# Patient Record
Sex: Male | Born: 1950 | Race: White | Hispanic: No | Smoking: Former smoker
Health system: Southern US, Community
[De-identification: ages and names within clinical notes are randomized; demographics above are authoritative.]

## PROBLEM LIST (undated history)

## (undated) ENCOUNTER — Encounter

## (undated) ENCOUNTER — Telehealth

## (undated) ENCOUNTER — Ambulatory Visit

## (undated) ENCOUNTER — Encounter: Attending: Surgery | Primary: Surgery

## (undated) ENCOUNTER — Non-Acute Institutional Stay: Payer: MEDICARE

## (undated) ENCOUNTER — Ambulatory Visit: Payer: MEDICARE

## (undated) ENCOUNTER — Ambulatory Visit: Attending: Nephrology | Primary: Nephrology

## (undated) DIAGNOSIS — C189 Malignant neoplasm of colon, unspecified: Secondary | ICD-10-CM

## (undated) DIAGNOSIS — C260 Malignant neoplasm of intestinal tract, part unspecified: Secondary | ICD-10-CM

## (undated) DIAGNOSIS — K219 Gastro-esophageal reflux disease without esophagitis: Secondary | ICD-10-CM

## (undated) DIAGNOSIS — F32A Depression, unspecified: Secondary | ICD-10-CM

## (undated) DIAGNOSIS — I10 Essential (primary) hypertension: Secondary | ICD-10-CM

## (undated) DIAGNOSIS — F419 Anxiety disorder, unspecified: Secondary | ICD-10-CM

## (undated) DIAGNOSIS — I350 Nonrheumatic aortic (valve) stenosis: Secondary | ICD-10-CM

## (undated) DIAGNOSIS — N189 Chronic kidney disease, unspecified: Secondary | ICD-10-CM

## (undated) DIAGNOSIS — E119 Type 2 diabetes mellitus without complications: Secondary | ICD-10-CM

## (undated) DIAGNOSIS — Z944 Liver transplant status: Secondary | ICD-10-CM

## (undated) DIAGNOSIS — F329 Major depressive disorder, single episode, unspecified: Secondary | ICD-10-CM

## (undated) DIAGNOSIS — R112 Nausea with vomiting, unspecified: Secondary | ICD-10-CM

## (undated) DIAGNOSIS — R011 Cardiac murmur, unspecified: Secondary | ICD-10-CM

## (undated) DIAGNOSIS — E7800 Pure hypercholesterolemia, unspecified: Secondary | ICD-10-CM

## (undated) DIAGNOSIS — K635 Polyp of colon: Secondary | ICD-10-CM

## (undated) DIAGNOSIS — G629 Polyneuropathy, unspecified: Secondary | ICD-10-CM

## (undated) DIAGNOSIS — Z9889 Other specified postprocedural states: Secondary | ICD-10-CM

## (undated) HISTORY — DX: Anxiety disorder, unspecified: F41.9

## (undated) HISTORY — DX: Polyneuropathy, unspecified: G62.9

## (undated) HISTORY — DX: Gastro-esophageal reflux disease without esophagitis: K21.9

## (undated) HISTORY — PX: TONSILLECTOMY: SUR1361

## (undated) HISTORY — DX: Pure hypercholesterolemia, unspecified: E78.00

## (undated) HISTORY — DX: Depression, unspecified: F32.A

## (undated) HISTORY — PX: COLON SURGERY: SHX602

## (undated) HISTORY — DX: Polyp of colon: K63.5

## (undated) HISTORY — PX: CHOLECYSTECTOMY: SHX55

## (undated) HISTORY — PX: LIVER TRANSPLANT: SHX410

## (undated) MED ORDER — MEGESTROL 40 MG TABLET: Freq: Every day | ORAL | 0 days

## (undated) MED ORDER — VITAMIN A CAPSULE 3,000 MCG RAE (10000 UNITS): Freq: Every day | ORAL | 0.00000 days

## (undated) MED ORDER — MELATONIN 3 MG CAPSULE: ORAL | 0 days

---
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
## 1994-04-25 HISTORY — PX: LIVER TRANSPLANT: SHX410

## 2011-07-04 DIAGNOSIS — Z944 Liver transplant status: Secondary | ICD-10-CM

## 2013-08-19 DIAGNOSIS — E119 Type 2 diabetes mellitus without complications: Secondary | ICD-10-CM

## 2013-08-19 DIAGNOSIS — C189 Malignant neoplasm of colon, unspecified: Secondary | ICD-10-CM | POA: Insufficient documentation

## 2014-09-28 DIAGNOSIS — E119 Type 2 diabetes mellitus without complications: Secondary | ICD-10-CM | POA: Insufficient documentation

## 2014-09-28 DIAGNOSIS — F329 Major depressive disorder, single episode, unspecified: Secondary | ICD-10-CM | POA: Diagnosis present

## 2014-09-28 DIAGNOSIS — Z79899 Other long term (current) drug therapy: Secondary | ICD-10-CM | POA: Insufficient documentation

## 2014-09-28 DIAGNOSIS — E1142 Type 2 diabetes mellitus with diabetic polyneuropathy: Secondary | ICD-10-CM | POA: Insufficient documentation

## 2014-09-28 DIAGNOSIS — E1169 Type 2 diabetes mellitus with other specified complication: Secondary | ICD-10-CM | POA: Insufficient documentation

## 2014-09-28 DIAGNOSIS — K219 Gastro-esophageal reflux disease without esophagitis: Secondary | ICD-10-CM | POA: Insufficient documentation

## 2014-09-28 DIAGNOSIS — M109 Gout, unspecified: Secondary | ICD-10-CM | POA: Insufficient documentation

## 2014-10-01 DIAGNOSIS — E291 Testicular hypofunction: Secondary | ICD-10-CM | POA: Insufficient documentation

## 2015-06-19 DIAGNOSIS — M5416 Radiculopathy, lumbar region: Secondary | ICD-10-CM | POA: Insufficient documentation

## 2015-06-19 DIAGNOSIS — H532 Diplopia: Secondary | ICD-10-CM | POA: Insufficient documentation

## 2015-06-19 DIAGNOSIS — G529 Cranial nerve disorder, unspecified: Secondary | ICD-10-CM | POA: Insufficient documentation

## 2015-06-23 DIAGNOSIS — G43009 Migraine without aura, not intractable, without status migrainosus: Secondary | ICD-10-CM | POA: Insufficient documentation

## 2015-06-26 DIAGNOSIS — E7800 Pure hypercholesterolemia, unspecified: Secondary | ICD-10-CM | POA: Insufficient documentation

## 2015-07-10 DIAGNOSIS — M542 Cervicalgia: Secondary | ICD-10-CM | POA: Insufficient documentation

## 2015-11-27 DIAGNOSIS — E11649 Type 2 diabetes mellitus with hypoglycemia without coma: Secondary | ICD-10-CM | POA: Insufficient documentation

## 2016-03-02 DIAGNOSIS — R634 Abnormal weight loss: Secondary | ICD-10-CM | POA: Insufficient documentation

## 2016-09-26 DIAGNOSIS — E875 Hyperkalemia: Secondary | ICD-10-CM | POA: Insufficient documentation

## 2016-10-14 ENCOUNTER — Inpatient Hospital Stay (HOSPITAL_COMMUNITY)
Admission: EM | Admit: 2016-10-14 | Discharge: 2016-10-17 | DRG: 872 | Disposition: A | Discharge status: Home / Self Care | Payer: Medicare Other | Attending: Family Medicine | Admitting: Family Medicine

## 2016-10-14 ENCOUNTER — Emergency Department (HOSPITAL_COMMUNITY): Payer: Medicare Other

## 2016-10-14 ENCOUNTER — Encounter (HOSPITAL_COMMUNITY): Payer: Self-pay | Admitting: Emergency Medicine

## 2016-10-14 DIAGNOSIS — D696 Thrombocytopenia, unspecified: Secondary | ICD-10-CM | POA: Diagnosis present

## 2016-10-14 DIAGNOSIS — R109 Unspecified abdominal pain: Secondary | ICD-10-CM | POA: Diagnosis not present

## 2016-10-14 DIAGNOSIS — A419 Sepsis, unspecified organism: Secondary | ICD-10-CM

## 2016-10-14 DIAGNOSIS — F3341 Major depressive disorder, recurrent, in partial remission: Secondary | ICD-10-CM | POA: Diagnosis present

## 2016-10-14 DIAGNOSIS — E1122 Type 2 diabetes mellitus with diabetic chronic kidney disease: Secondary | ICD-10-CM | POA: Diagnosis present

## 2016-10-14 DIAGNOSIS — K7290 Hepatic failure, unspecified without coma: Secondary | ICD-10-CM | POA: Diagnosis present

## 2016-10-14 DIAGNOSIS — E860 Dehydration: Secondary | ICD-10-CM | POA: Diagnosis present

## 2016-10-14 DIAGNOSIS — Z9049 Acquired absence of other specified parts of digestive tract: Secondary | ICD-10-CM

## 2016-10-14 DIAGNOSIS — E875 Hyperkalemia: Secondary | ICD-10-CM | POA: Diagnosis present

## 2016-10-14 DIAGNOSIS — N3289 Other specified disorders of bladder: Secondary | ICD-10-CM | POA: Diagnosis present

## 2016-10-14 DIAGNOSIS — K529 Noninfective gastroenteritis and colitis, unspecified: Secondary | ICD-10-CM | POA: Diagnosis present

## 2016-10-14 DIAGNOSIS — E1140 Type 2 diabetes mellitus with diabetic neuropathy, unspecified: Secondary | ICD-10-CM | POA: Diagnosis present

## 2016-10-14 DIAGNOSIS — F418 Other specified anxiety disorders: Secondary | ICD-10-CM | POA: Diagnosis present

## 2016-10-14 DIAGNOSIS — E872 Acidosis: Secondary | ICD-10-CM | POA: Diagnosis present

## 2016-10-14 DIAGNOSIS — Z6838 Body mass index (BMI) 38.0-38.9, adult: Secondary | ICD-10-CM

## 2016-10-14 DIAGNOSIS — Z85038 Personal history of other malignant neoplasm of large intestine: Secondary | ICD-10-CM

## 2016-10-14 DIAGNOSIS — K7469 Other cirrhosis of liver: Secondary | ICD-10-CM | POA: Diagnosis present

## 2016-10-14 DIAGNOSIS — F329 Major depressive disorder, single episode, unspecified: Secondary | ICD-10-CM | POA: Diagnosis present

## 2016-10-14 DIAGNOSIS — M109 Gout, unspecified: Secondary | ICD-10-CM | POA: Diagnosis present

## 2016-10-14 DIAGNOSIS — R509 Fever, unspecified: Secondary | ICD-10-CM | POA: Diagnosis present

## 2016-10-14 DIAGNOSIS — I129 Hypertensive chronic kidney disease with stage 1 through stage 4 chronic kidney disease, or unspecified chronic kidney disease: Secondary | ICD-10-CM | POA: Diagnosis present

## 2016-10-14 DIAGNOSIS — D849 Immunodeficiency, unspecified: Secondary | ICD-10-CM

## 2016-10-14 DIAGNOSIS — E669 Obesity, unspecified: Secondary | ICD-10-CM | POA: Diagnosis present

## 2016-10-14 DIAGNOSIS — N183 Chronic kidney disease, stage 3 (moderate): Secondary | ICD-10-CM | POA: Diagnosis present

## 2016-10-14 DIAGNOSIS — N179 Acute kidney failure, unspecified: Secondary | ICD-10-CM | POA: Diagnosis present

## 2016-10-14 DIAGNOSIS — E119 Type 2 diabetes mellitus without complications: Secondary | ICD-10-CM

## 2016-10-14 DIAGNOSIS — D899 Disorder involving the immune mechanism, unspecified: Secondary | ICD-10-CM

## 2016-10-14 DIAGNOSIS — Z944 Liver transplant status: Secondary | ICD-10-CM

## 2016-10-14 DIAGNOSIS — Z794 Long term (current) use of insulin: Secondary | ICD-10-CM

## 2016-10-14 DIAGNOSIS — E785 Hyperlipidemia, unspecified: Secondary | ICD-10-CM | POA: Diagnosis present

## 2016-10-14 DIAGNOSIS — A084 Viral intestinal infection, unspecified: Secondary | ICD-10-CM | POA: Diagnosis present

## 2016-10-14 HISTORY — DX: Cardiac murmur, unspecified: R01.1

## 2016-10-14 HISTORY — DX: Malignant neoplasm of colon, unspecified: C18.9

## 2016-10-14 HISTORY — DX: Type 2 diabetes mellitus without complications: E11.9

## 2016-10-14 HISTORY — DX: Liver transplant status: Z94.4

## 2016-10-14 HISTORY — DX: Essential (primary) hypertension: I10

## 2016-10-14 LAB — COMPREHENSIVE METABOLIC PANEL
ALT: 84 U/L — AB (ref 17–63)
AST: 158 U/L — AB (ref 15–41)
Albumin: 3.7 g/dL (ref 3.5–5.0)
Alkaline Phosphatase: 251 U/L — ABNORMAL HIGH (ref 38–126)
Anion gap: 8 (ref 5–15)
BUN: 16 mg/dL (ref 6–20)
CHLORIDE: 106 mmol/L (ref 101–111)
CO2: 23 mmol/L (ref 22–32)
CREATININE: 1.4 mg/dL — AB (ref 0.61–1.24)
Calcium: 9.3 mg/dL (ref 8.9–10.3)
GFR calc non Af Amer: 51 mL/min — ABNORMAL LOW (ref >60)
GFR, EST AFRICAN AMERICAN: 59 mL/min — AB (ref >60)
Glucose, Bld: 121 mg/dL — ABNORMAL HIGH (ref 65–99)
POTASSIUM: 5.1 mmol/L (ref 3.5–5.1)
Sodium: 137 mmol/L (ref 135–145)
Total Bilirubin: 2.1 mg/dL — ABNORMAL HIGH (ref 0.3–1.2)
Total Protein: 6.8 g/dL (ref 6.5–8.1)

## 2016-10-14 LAB — URINALYSIS, ROUTINE W REFLEX MICROSCOPIC
Bilirubin Urine: NEGATIVE
GLUCOSE, UA: NEGATIVE mg/dL
Hgb urine dipstick: NEGATIVE
KETONES UR: NEGATIVE mg/dL
Leukocytes, UA: NEGATIVE
Nitrite: NEGATIVE
Protein, ur: NEGATIVE mg/dL
Specific Gravity, Urine: 1.009 (ref 1.005–1.030)
pH: 5 (ref 5.0–8.0)

## 2016-10-14 LAB — CBG MONITORING, ED: Glucose-Capillary: 114 mg/dL — ABNORMAL HIGH (ref 65–99)

## 2016-10-14 LAB — CBC WITH DIFFERENTIAL/PLATELET
Basophils Absolute: 0 10*3/uL (ref 0.0–0.1)
Basophils Relative: 0 %
EOS PCT: 2 %
Eosinophils Absolute: 0.2 10*3/uL (ref 0.0–0.7)
HCT: 37.8 % — ABNORMAL LOW (ref 39.0–52.0)
Hemoglobin: 12.3 g/dL — ABNORMAL LOW (ref 13.0–17.0)
Lymphocytes Relative: 10 %
Lymphs Abs: 1 10*3/uL (ref 0.7–4.0)
MCH: 29.5 pg (ref 26.0–34.0)
MCHC: 32.5 g/dL (ref 30.0–36.0)
MCV: 90.6 fL (ref 78.0–100.0)
Monocytes Absolute: 1 10*3/uL (ref 0.1–1.0)
Monocytes Relative: 10 %
Neutro Abs: 7.3 10*3/uL (ref 1.7–7.7)
Neutrophils Relative %: 78 %
PLATELETS: 111 10*3/uL — AB (ref 150–400)
RBC: 4.17 MIL/uL — ABNORMAL LOW (ref 4.22–5.81)
RDW: 14.1 % (ref 11.5–15.5)
WBC: 9.4 10*3/uL (ref 4.0–10.5)

## 2016-10-14 LAB — I-STAT CG4 LACTIC ACID, ED
Lactic Acid, Venous: 3.38 mmol/L (ref 0.5–1.9)
Lactic Acid, Venous: 2.22 mmol/L (ref 0.5–1.9)

## 2016-10-14 MED ORDER — PIPERACILLIN-TAZOBACTAM 3.375 G IVPB 30 MIN
3.3750 g | Freq: Once | INTRAVENOUS | Status: AC
  Administered 2016-10-14: 3.375 g via INTRAVENOUS
  Filled 2016-10-14: qty 50

## 2016-10-14 MED ORDER — VANCOMYCIN HCL IN DEXTROSE 750-5 MG/150ML-% IV SOLN
750.0000 mg | Freq: Two times a day (BID) | INTRAVENOUS | Status: DC
  Administered 2016-10-15 – 2016-10-17 (×5): 750 mg via INTRAVENOUS
  Filled 2016-10-14 (×5): qty 150

## 2016-10-14 MED ORDER — SODIUM CHLORIDE 0.9 % IV BOLUS (SEPSIS)
1000.0000 mL | Freq: Once | INTRAVENOUS | Status: AC
  Administered 2016-10-14: 1000 mL via INTRAVENOUS

## 2016-10-14 MED ORDER — VANCOMYCIN HCL IN DEXTROSE 1-5 GM/200ML-% IV SOLN
1000.0000 mg | Freq: Once | INTRAVENOUS | Status: AC
  Administered 2016-10-14: 1000 mg via INTRAVENOUS
  Filled 2016-10-14: qty 200

## 2016-10-14 MED ORDER — SODIUM CHLORIDE 0.9 % IV BOLUS (SEPSIS): 1000.0000 mL | Freq: Once | INTRAVENOUS | Status: DC

## 2016-10-14 MED ORDER — PIPERACILLIN-TAZOBACTAM 3.375 G IVPB
3.3750 g | Freq: Three times a day (TID) | INTRAVENOUS | Status: DC
  Administered 2016-10-15 – 2016-10-17 (×8): 3.375 g via INTRAVENOUS
  Filled 2016-10-14 (×9): qty 50

## 2016-10-14 MED ORDER — LACTATED RINGERS IV BOLUS (SEPSIS)
1000.0000 mL | Freq: Once | INTRAVENOUS | Status: AC
  Administered 2016-10-14: 1000 mL via INTRAVENOUS

## 2016-10-14 MED ORDER — IOPAMIDOL (ISOVUE-300) INJECTION 61%
INTRAVENOUS | Status: AC
  Administered 2016-10-14: 100 mL
  Filled 2016-10-14: qty 100

## 2016-10-14 NOTE — ED Notes (Signed)
Patient transported to X-ray 

## 2016-10-14 NOTE — ED Triage Notes (Signed)
Pt st's he started having elevated temp earlier today with no other symptoms.  Pt st's his fever at home was 103 and he took tylenol for same.  Pt is a liver transplant pt.  Pt also st's he has been having night sweats.

## 2016-10-14 NOTE — Progress Notes (Signed)
Pharmacy Antibiotic Note Allen Schultz is a 67 y.o. male admitted on 10/14/2016 with fever and night sweats. Pt has hx of liver transplant and is on antirejection medications. LA 3.3, Tm 103, WBC wnl. Initially a vancomycin 1 g doses was ordered and given in the ED. I discussed with RN, will give an additional 1 g to equal a 2 g load.   Plan: -Vancomycin 2 g IV x1 then 750/12h -Zosyn 3.375 g IV q8h -Monitor renal fx, cultures, VT at Css   Height: 5\' 10"  (177.8 cm) Weight: 262 lb (118.8 kg) IBW/kg (Calculated) : 73  Temp (24hrs), Avg:101.2 F (38.4 C), Min:101.2 F (38.4 C), Max:101.2 F (38.4 C)   Recent Labs Lab 10/14/16 1920 10/14/16 1929  WBC 9.4  --   CREATININE 1.40*  --   LATICACIDVEN  --  3.38*    Estimated Creatinine Clearance: 67.9 mL/min (A) (by C-G formula based on SCr of 1.4 mg/dL (H)).    Allergies not on file  Antimicrobials this admission: 6/22 zosyn > 6/22 vancomycin >   Dose adjustments this admission: N/A   Microbiology results: 6/22 blood cx: 6/22 urine cx:   Harvel Quale 10/14/2016 8:08 PM

## 2016-10-14 NOTE — ED Notes (Signed)
Per pt request, pt's brother called by this nurse to update brother on plan of care for pt to be admitted to the hospital.

## 2016-10-14 NOTE — ED Notes (Signed)
Patient transported to CT 

## 2016-10-14 NOTE — ED Notes (Signed)
[facility] contacted to activate Code Sepsis 

## 2016-10-14 NOTE — ED Provider Notes (Signed)
[place] DEPT Provider Note   CSN: [id number] Arrival date & time: 10/14/16  1812     History   Chief Complaint Chief Complaint  Patient presents with  . Fever    HPI Allen Schultz is a 66 y.o. male.  HPI   66 yo M with h/o DM, HTN, HLD, colon CA s/p partial resection, h/o liver failure s/p liver transplant at [facility] on prograf who p/w fever. Pt states he woke up this am feeling mildly nauseous but otherwise well. He has not had an appetite and has had very little to eat or drink. At around 2 PM, he began to experience severe chills. He began shaking severely and had to sit in his car, in the heat, to feel warm. He has since had fever up to 103F orally. He took one 325 mg tylenol. He called his endocrinologist after trying his transplant/PCP docs and was told to come to the ED. He o/w denies complaints. He has persistent mild nausea but no abd pain, no vomiting or diarrhea. nO recent sick contacts. No rhinorrhea, congestion, or cough.  Past Medical History:  Diagnosis Date  . Colon cancer ([place])   . Diabetes mellitus without complication ([place])   . Heart murmur   . Hypertension   . Liver transplant recipient [facility])     Patient Active Problem List   Diagnosis Date Noted  . Liver transplant recipient [facility]) 10/15/2016  . Immunosuppressed status ([place]) 10/15/2016  . CKD (chronic kidney disease), stage III 10/15/2016  . Fever and chills 10/15/2016  . Bladder wall thickening 10/15/2016  . Thrombocytopenia ([place]) 10/15/2016  . Depression with anxiety 10/15/2016  . 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 CHOLECYSTECTOMY    . COLON SURGERY    . LIVER TRANSPLANT    . TONSILLECTOMY         Home Medications    Prior to Admission medications   Medication Sig Start Date End Date Taking? Authorizing Provider  allopurinol (ZYLOPRIM) 100 MG tablet Take 100 mg by mouth daily.   Yes [provider]  baclofen (LIORESAL) 10 MG tablet Take 10 mg by mouth 3 (three) times daily as needed for muscle spasms.   Yes [provider]  colchicine 0.6 MG tablet Take 0.6 mg by mouth daily as needed (gout).   Yes [provider]  diazepam (VALIUM) 10 MG tablet Take 10 mg by mouth daily as needed (muscle spasms).   Yes [provider]  escitalopram (LEXAPRO) 20 MG tablet Take 10 mg by mouth daily as needed (depression).   Yes [provider]  ezetimibe (ZETIA) 10 MG tablet Take 10 mg by mouth daily.   Yes [provider]  gabapentin (NEURONTIN) 100 MG capsule Take 300 mg by mouth 2 (two) times daily.   Yes [provider]  insulin lispro protamine-lispro (HUMALOG 75/25 MIX) (75-25) 100 UNIT/ML SUSP injection Inject 20-30 Units into the skin 2 (two) times daily before a meal.   Yes [provider]  loperamide (IMODIUM) 2 MG capsule Take 6 mg by mouth 2 (two) times daily.   Yes [provider]  mupirocin ointment (BACTROBAN) 2 % Apply 1 application topically 2 (two) times daily as needed (wound care).   Yes [provider]  nebivolol (BYSTOLIC) 5 MG tablet Take 5 mg by mouth daily.   Yes [provider]  patiromer (VELTASSA) 8.4 g packet Take 8.4 g by  mouth 2 (two) times daily. Dissolve packet in water and drink   Yes [provider]  promethazine (PHENERGAN) 25 MG tablet Take 25 mg by mouth every 6 (six) hours as needed for nausea or vomiting.   Yes [provider]  ranitidine (ZANTAC) 150 MG tablet Take 150 mg by mouth daily.   Yes [provider]  sodium bicarbonate 650 MG tablet Take 1,300 mg by mouth 2 (two) times daily.   Yes [provider]  Sodium Fluoride (PREVIDENT 5000 BOOSTER PLUS) 1.1 % PSTE Place 1 application onto teeth 2 (two) times daily.   Yes [provider]  tacrolimus (PROGRAF) 1 MG capsule Take 2 mg by mouth 2 (two) times daily.   Yes  [provider]  tamsulosin (FLOMAX) 0.4 MG CAPS capsule Take 0.4 mg by mouth daily.   Yes [provider]  traMADol (ULTRAM) 50 MG tablet Take 50 mg by mouth 2 (two) times daily as needed (pain).   Yes [provider]    Family History History reviewed. No pertinent family history.  Social History Social History  Substance Use Topics  . Smoking status: Never Smoker  . Smokeless tobacco: Never Used  . Alcohol use No     Allergies   Clinoril [sulindac] and Statins   Review of Systems Review of Systems  Constitutional: Positive for appetite change, chills, fatigue and fever.  Gastrointestinal: Positive for nausea.  All other systems reviewed and are negative.    Physical Exam Updated Vital Signs BP (!) 155/53 (BP Location: Left Arm)   Pulse 71   Temp 99.8 F (37.7 C) (Oral)   Resp 18   Ht 5\' 10"  (1.778 m)   Wt 122 kg (269 lb)   SpO2 97%   BMI 38.60 kg/m   Physical Exam  Constitutional: He is oriented to person, place, and time. He appears well-developed and well-nourished. No distress.  HENT:  Head: Normocephalic and atraumatic.  Dry MM  Eyes: Conjunctivae are normal.  Neck: Neck supple.  Cardiovascular: Normal rate, regular rhythm and normal heart sounds.  Exam reveals no friction rub.   No murmur heard. Pulmonary/Chest: Effort normal and breath sounds normal. No respiratory distress. He has no wheezes. He has no rales.  Abdominal: Soft. He exhibits no distension. There is tenderness (minimal, diffuse).  Musculoskeletal: He exhibits no edema.  Neurological: He is alert and oriented to person, place, and time. He exhibits normal muscle tone.  Skin: Skin is warm. Capillary refill takes less than 2 seconds.  Psychiatric: He has a normal mood and affect.  Nursing note and vitals reviewed.    ED Treatments / Results  Labs (all labs ordered are listed, but only abnormal results are displayed) Labs Reviewed  CBC WITH  DIFFERENTIAL/PLATELET - Abnormal; Notable for the following:       Result Value   RBC 4.17 (*)    Hemoglobin 12.3 (*)    HCT 37.8 (*)    Platelets 111 (*)    All other components within normal limits  COMPREHENSIVE METABOLIC PANEL - Abnormal; Notable for the following:    Glucose, Bld 121 (*)    Creatinine, Ser 1.40 (*)    AST 158 (*)    ALT 84 (*)    Alkaline Phosphatase 251 (*)    Total Bilirubin 2.1 (*)    GFR calc non Af Amer 51 (*)    GFR calc Af Amer 59 (*)    All other components within normal limits  GLUCOSE,  CAPILLARY - Abnormal; Notable for the following:    Glucose-Capillary 119 (*)    All other components within normal limits  I-STAT CG4 LACTIC ACID, ED - Abnormal; Notable for the following:    Lactic Acid, Venous 3.38 (*)    All other components within normal limits  I-STAT CG4 LACTIC ACID, ED - Abnormal; Notable for the following:    Lactic Acid, Venous 2.22 (*)    All other components within normal limits  CBG MONITORING, ED - Abnormal; Notable for the following:    Glucose-Capillary 114 (*)    All other components within normal limits  CULTURE, BLOOD (ROUTINE X 2)  CULTURE, BLOOD (ROUTINE X 2)  URINE CULTURE  CULTURE, EXPECTORATED SPUTUM-ASSESSMENT  URINALYSIS, ROUTINE W REFLEX MICROSCOPIC  HIV ANTIBODY (ROUTINE TESTING)  COMPREHENSIVE METABOLIC PANEL  CBC  PROTIME-INR  LACTIC ACID, PLASMA  LACTIC ACID, PLASMA    EKG  EKG Interpretation  Date/Time:  Friday October 14 2016 20:42:56 EDT Ventricular Rate:  76 PR Interval:    QRS Duration: 110 QT Interval:  376 QTC Calculation: 423 R Axis:   -17 Text Interpretation:  Sinus rhythm Abnormal R-wave progression, early transition Left ventricular hypertrophy No old tracing to compare Confirmed by Duffy Bruce [phone]) on 10/14/2016 9:08:29 PM       Radiology Dg Chest 2 View  Result Date: 10/14/2016 CLINICAL DATA:  Acute onset of fever and diaphoresis. Initial encounter. EXAM: CHEST  2 VIEW COMPARISON:   None. FINDINGS: The lungs are well-aerated. Pulmonary vascularity is at the upper limits of normal. Small bilateral pleural effusions are suggested on the lateral view. There is no evidence of focal opacification or pneumothorax. A rounded density at the periphery of the right lung base appears to reflect a remote healed rib fracture. The heart is normal in size; the mediastinal contour is within normal limits. No acute osseous abnormalities are seen. IMPRESSION: Suggestion of small bilateral pleural effusions. Lungs otherwise grossly clear. Electronically Signed   By: Garald Balding M.D.   On: 10/14/2016 20:33   Ct Abdomen Pelvis W Contrast  Result Date: 10/14/2016 CLINICAL DATA:  Acute onset of generalized abdominal pain. Initial encounter. EXAM: CT ABDOMEN AND PELVIS WITH CONTRAST TECHNIQUE: Multidetector CT imaging of the abdomen and pelvis was performed using the standard protocol following bolus administration of intravenous contrast. CONTRAST:  129mL ISOVUE-300 IOPAMIDOL (ISOVUE-300) INJECTION 61% COMPARISON:  None. FINDINGS: Lower chest: Diffuse coronary artery calcifications are seen. Minimal right basilar atelectasis or scarring is noted. A slightly prominent 9 mm epicardial fat pad node is noted. Hepatobiliary: Scattered pneumobilia is noted. The transplant liver is grossly unremarkable in appearance. The patient is status post cholecystectomy. Vague soft tissue inflammation is seen tracking about the liver and gallbladder fossa, possibly reflecting postoperative change. Pancreas: Mildly prominent peripancreatic nodes measure up to 1.3 cm in short axis. The pancreas is grossly unremarkable. Spleen: The spleen is enlarged, measuring 19.1 cm in length. Adrenals/Urinary Tract: The adrenal glands are grossly unremarkable. Nonspecific perinephric stranding is noted bilaterally. There is no evidence of hydronephrosis. No renal or ureteral stones are identified. There is slight asymmetric prominence of the  right ureter. Stomach/Bowel: The patient is status post resection of much of the colon. The ileocolic anastomosis at the lower mid abdomen is grossly unremarkable. The small bowel is grossly unremarkable. The stomach is largely decompressed and grossly unremarkable. There is mild nonspecific inflammation at the proximal mesentery. Vascular/Lymphatic: Scattered calcification is seen along the abdominal aorta and its branches. The abdominal aorta  is otherwise grossly unremarkable. The inferior vena cava is grossly unremarkable. No retroperitoneal lymphadenopathy is seen. No pelvic sidewall lymphadenopathy is identified. Scattered prominent nodes are noted about the IVC, measuring up to 1.0 cm in short axis. A mildly prominent 1.2 cm mesenteric node is noted. No pelvic sidewall lymphadenopathy is seen. Reproductive: Soft tissue inflammation is noted about the right side of the bladder, with associated right-sided wall thickening. This may reflect cystitis, though underlying mass cannot be excluded. The prostate remains normal in size. Other: No additional soft tissue abnormalities are seen. Musculoskeletal: No acute osseous abnormalities are identified. Multilevel vacuum phenomenon is noted along the thoracic and lumbar spine. There is mild chronic loss of height at vertebral body L1. The visualized musculature is unremarkable in appearance. IMPRESSION: 1. Soft tissue inflammation about the right side of the bladder, with associated right-sided wall thickening. This may reflect acute cystitis, though underlying mass cannot be excluded. Cystoscopy could be considered for further evaluation. 2. Transplant liver is grossly unremarkable. Underlying mild pneumobilia noted. Vague soft tissue inflammation about the liver and gallbladder fossa may reflect postoperative change. 3. Significant splenomegaly. 4. Mild nonspecific inflammation at the proximal mesentery. Prominent retroperitoneal nodes noted about the IVC and  pancreas, and mildly prominent 1.2 cm mesenteric node seen. These are of uncertain significance. 5. Diffuse coronary artery calcifications seen. 6. Ileocolic anastomosis is grossly unremarkable in appearance. 7. Mild chronic loss of height at vertebral body L1. Electronically Signed   By: Garald Balding M.D.   On: 10/14/2016 23:48    Procedures .Critical Care Performed by: Duffy Bruce Authorized by: Duffy Bruce   Critical care provider statement:    Critical care time (minutes):  35   (including critical care time)  CRITICAL CARE Performed by: Evonnie Pat   Total critical care time: 35 minutes  Critical care time was exclusive of separately billable procedures and treating other patients.  Critical care was necessary to treat or prevent imminent or life-threatening deterioration.  Critical care was time spent personally by me on the following activities: development of treatment plan with patient and/or surrogate as well as nursing, discussions with consultants, evaluation of patient's response to treatment, examination of patient, obtaining history from patient or surrogate, ordering and performing treatments and interventions, ordering and review of laboratory studies, ordering and review of radiographic studies, pulse oximetry and re-evaluation of patient's condition.   Medications Ordered in ED Medications  piperacillin-tazobactam (ZOSYN) IVPB 3.375 g (3.375 g Intravenous New Bag/Given 10/15/16 0211)  vancomycin (VANCOCIN) IVPB 750 mg/150 ml premix (not administered)  oxyCODONE (Oxy IR/ROXICODONE) immediate release tablet 5-10 mg (10 mg Oral Given 10/15/16 0208)  ondansetron (ZOFRAN) injection 4 mg (not administered)  metoCLOPramide (REGLAN) injection 5 mg (not administered)  0.9 %  sodium chloride infusion ( Intravenous New Bag/Given 10/15/16 0211)  allopurinol (ZYLOPRIM) tablet 100 mg (not administered)  baclofen (LIORESAL) tablet 10 mg (not administered)  diazepam  (VALIUM) tablet 10 mg (not administered)  escitalopram (LEXAPRO) tablet 10 mg (not administered)  ezetimibe (ZETIA) tablet 10 mg (not administered)  gabapentin (NEURONTIN) capsule 300 mg (not administered)  nebivolol (BYSTOLIC) tablet 5 mg (not administered)  patiromer Daryll Drown) packet 8.4 g (not administered)  famotidine (PEPCID) tablet 20 mg (not administered)  sodium bicarbonate tablet 1,300 mg (not administered)  tacrolimus (PROGRAF) capsule 2 mg (2 mg Oral Given 10/15/16 0208)  tamsulosin (FLOMAX) capsule 0.4 mg (not administered)  heparin injection 5,000 Units (not administered)  acetaminophen (TYLENOL) tablet 650 mg (650 mg Oral Given 10/15/16  0208)    Or  acetaminophen (TYLENOL) suppository 650 mg ( Rectal See Alternative 10/15/16 0208)  sodium chloride 0.9 % bolus 1,000 mL (0 mLs Intravenous Stopped 10/14/16 2117)    And  sodium chloride 0.9 % bolus 1,000 mL (0 mLs Intravenous Stopped 10/14/16 2138)  piperacillin-tazobactam (ZOSYN) IVPB 3.375 g (0 g Intravenous Stopped 10/14/16 2047)  vancomycin (VANCOCIN) IVPB 1000 mg/200 mL premix (0 mg Intravenous Stopped 10/14/16 2200)  lactated ringers bolus 1,000 mL (0 mLs Intravenous Stopped 10/14/16 2200)  lactated ringers bolus 1,000 mL (0 mLs Intravenous Stopped 10/14/16 2057)  vancomycin (VANCOCIN) IVPB 1000 mg/200 mL premix (0 mg Intravenous Stopped 10/14/16 2244)  iopamidol (ISOVUE-300) 61 % injection (100 mLs  Contrast Given 10/14/16 2316)     Initial Impression / Assessment and Plan / ED Course  I have reviewed the triage vital signs and the nursing notes.  Pertinent labs & imaging results that were available during my care of the patient were reviewed by me and considered in my medical decision making (see chart for details).     66 yo M with h/o liver transplant, DM, HTN here with fever to 103, chills. LA 3.38 on arrival, CODE SEPSIS initaited though pt has had no hypotension. Etiology of fever/sepsis unclear - will check broad labs,  CXR, urine, and re-assess. No abdominal TTP on my exam.  He is mentating well. Broad spectrum ABX and fluids given.  Repeat LA improving. CT A/P shows possible UTI/pyelo, o/w no acute surgical abnormalities. Will admit for fever, sepsis of unknown source in immunosuppressed patient.  Final Clinical Impressions(s) / ED Diagnoses   Final diagnoses:  Sepsis, due to unspecified organism ([place])  Immunocompromised [facility])    New Prescriptions Current Discharge Medication List       Duffy Bruce, MD 10/15/16 0225

## 2016-10-15 ENCOUNTER — Encounter (HOSPITAL_COMMUNITY): Payer: Self-pay | Admitting: Family Medicine

## 2016-10-15 DIAGNOSIS — F418 Other specified anxiety disorders: Secondary | ICD-10-CM | POA: Diagnosis present

## 2016-10-15 DIAGNOSIS — E1140 Type 2 diabetes mellitus with diabetic neuropathy, unspecified: Secondary | ICD-10-CM | POA: Diagnosis present

## 2016-10-15 DIAGNOSIS — K7290 Hepatic failure, unspecified without coma: Secondary | ICD-10-CM | POA: Diagnosis present

## 2016-10-15 DIAGNOSIS — Z794 Long term (current) use of insulin: Secondary | ICD-10-CM | POA: Diagnosis not present

## 2016-10-15 DIAGNOSIS — Z6838 Body mass index (BMI) 38.0-38.9, adult: Secondary | ICD-10-CM | POA: Diagnosis not present

## 2016-10-15 DIAGNOSIS — N183 Chronic kidney disease, stage 3 unspecified: Secondary | ICD-10-CM | POA: Diagnosis present

## 2016-10-15 DIAGNOSIS — D696 Thrombocytopenia, unspecified: Secondary | ICD-10-CM | POA: Diagnosis present

## 2016-10-15 DIAGNOSIS — Z9049 Acquired absence of other specified parts of digestive tract: Secondary | ICD-10-CM | POA: Diagnosis not present

## 2016-10-15 DIAGNOSIS — A419 Sepsis, unspecified organism: Secondary | ICD-10-CM | POA: Diagnosis present

## 2016-10-15 DIAGNOSIS — N3289 Other specified disorders of bladder: Secondary | ICD-10-CM | POA: Diagnosis not present

## 2016-10-15 DIAGNOSIS — A084 Viral intestinal infection, unspecified: Secondary | ICD-10-CM | POA: Diagnosis present

## 2016-10-15 DIAGNOSIS — E1122 Type 2 diabetes mellitus with diabetic chronic kidney disease: Secondary | ICD-10-CM | POA: Diagnosis present

## 2016-10-15 DIAGNOSIS — Z944 Liver transplant status: Secondary | ICD-10-CM

## 2016-10-15 DIAGNOSIS — E785 Hyperlipidemia, unspecified: Secondary | ICD-10-CM | POA: Diagnosis present

## 2016-10-15 DIAGNOSIS — D899 Disorder involving the immune mechanism, unspecified: Secondary | ICD-10-CM

## 2016-10-15 DIAGNOSIS — K529 Noninfective gastroenteritis and colitis, unspecified: Secondary | ICD-10-CM | POA: Diagnosis present

## 2016-10-15 DIAGNOSIS — E860 Dehydration: Secondary | ICD-10-CM | POA: Diagnosis present

## 2016-10-15 DIAGNOSIS — E875 Hyperkalemia: Secondary | ICD-10-CM | POA: Diagnosis present

## 2016-10-15 DIAGNOSIS — I129 Hypertensive chronic kidney disease with stage 1 through stage 4 chronic kidney disease, or unspecified chronic kidney disease: Secondary | ICD-10-CM | POA: Diagnosis present

## 2016-10-15 DIAGNOSIS — E119 Type 2 diabetes mellitus without complications: Secondary | ICD-10-CM

## 2016-10-15 DIAGNOSIS — E669 Obesity, unspecified: Secondary | ICD-10-CM | POA: Diagnosis present

## 2016-10-15 DIAGNOSIS — D849 Immunodeficiency, unspecified: Secondary | ICD-10-CM

## 2016-10-15 DIAGNOSIS — Z85038 Personal history of other malignant neoplasm of large intestine: Secondary | ICD-10-CM | POA: Diagnosis not present

## 2016-10-15 DIAGNOSIS — N179 Acute kidney failure, unspecified: Secondary | ICD-10-CM | POA: Diagnosis present

## 2016-10-15 DIAGNOSIS — E872 Acidosis: Secondary | ICD-10-CM | POA: Diagnosis present

## 2016-10-15 DIAGNOSIS — M109 Gout, unspecified: Secondary | ICD-10-CM | POA: Diagnosis present

## 2016-10-15 DIAGNOSIS — K7469 Other cirrhosis of liver: Secondary | ICD-10-CM | POA: Diagnosis present

## 2016-10-15 DIAGNOSIS — R509 Fever, unspecified: Secondary | ICD-10-CM | POA: Diagnosis present

## 2016-10-15 DIAGNOSIS — R109 Unspecified abdominal pain: Secondary | ICD-10-CM | POA: Diagnosis present

## 2016-10-15 DIAGNOSIS — F3341 Major depressive disorder, recurrent, in partial remission: Secondary | ICD-10-CM | POA: Diagnosis present

## 2016-10-15 LAB — LACTIC ACID, PLASMA
LACTIC ACID, VENOUS: 0.9 mmol/L (ref 0.5–1.9)
Lactic Acid, Venous: 1.4 mmol/L (ref 0.5–1.9)

## 2016-10-15 LAB — CBC
HEMATOCRIT: 37.7 % — AB (ref 39.0–52.0)
HEMOGLOBIN: 12.3 g/dL — AB (ref 13.0–17.0)
MCH: 29.4 pg (ref 26.0–34.0)
MCHC: 32.6 g/dL (ref 30.0–36.0)
MCV: 90.2 fL (ref 78.0–100.0)
Platelets: 104 10*3/uL — ABNORMAL LOW (ref 150–400)
RBC: 4.18 MIL/uL — AB (ref 4.22–5.81)
RDW: 14.3 % (ref 11.5–15.5)
WBC: 8.6 10*3/uL (ref 4.0–10.5)

## 2016-10-15 LAB — COMPREHENSIVE METABOLIC PANEL
ALBUMIN: 3.4 g/dL — AB (ref 3.5–5.0)
ALT: 74 U/L — ABNORMAL HIGH (ref 17–63)
ANION GAP: 8 (ref 5–15)
AST: 124 U/L — ABNORMAL HIGH (ref 15–41)
Alkaline Phosphatase: 215 U/L — ABNORMAL HIGH (ref 38–126)
BUN: 14 mg/dL (ref 6–20)
CHLORIDE: 106 mmol/L (ref 101–111)
CO2: 21 mmol/L — AB (ref 22–32)
Calcium: 8.8 mg/dL — ABNORMAL LOW (ref 8.9–10.3)
Creatinine, Ser: 1.3 mg/dL — ABNORMAL HIGH (ref 0.61–1.24)
GFR calc non Af Amer: 56 mL/min — ABNORMAL LOW (ref >60)
GLUCOSE: 124 mg/dL — AB (ref 65–99)
Potassium: 5 mmol/L (ref 3.5–5.1)
Sodium: 135 mmol/L (ref 135–145)
Total Bilirubin: 4.3 mg/dL — ABNORMAL HIGH (ref 0.3–1.2)
Total Protein: 6.7 g/dL (ref 6.5–8.1)

## 2016-10-15 LAB — HIV ANTIBODY (ROUTINE TESTING W REFLEX): HIV SCREEN 4TH GENERATION: NONREACTIVE

## 2016-10-15 LAB — GLUCOSE, CAPILLARY
GLUCOSE-CAPILLARY: 119 mg/dL — AB (ref 65–99)
GLUCOSE-CAPILLARY: 132 mg/dL — AB (ref 65–99)
GLUCOSE-CAPILLARY: 155 mg/dL — AB (ref 65–99)
GLUCOSE-CAPILLARY: 140 mg/dL — AB (ref 65–99)
Glucose-Capillary: 112 mg/dL — ABNORMAL HIGH (ref 65–99)

## 2016-10-15 LAB — PROTIME-INR
INR: 1.17
Prothrombin Time: 15 seconds (ref 11.4–15.2)

## 2016-10-15 MED ORDER — NEBIVOLOL HCL 5 MG PO TABS
5.0000 mg | ORAL_TABLET | Freq: Every day | ORAL | Status: DC
  Administered 2016-10-15 – 2016-10-17 (×3): 5 mg via ORAL
  Filled 2016-10-15 (×3): qty 1

## 2016-10-15 MED ORDER — HEPARIN SODIUM (PORCINE) 5000 UNIT/ML IJ SOLN
5000.0000 [IU] | Freq: Three times a day (TID) | INTRAMUSCULAR | Status: DC
  Administered 2016-10-15 – 2016-10-17 (×8): 5000 [IU] via SUBCUTANEOUS
  Filled 2016-10-15 (×7): qty 1

## 2016-10-15 MED ORDER — FAMOTIDINE 20 MG PO TABS
20.0000 mg | ORAL_TABLET | Freq: Every day | ORAL | Status: DC
  Administered 2016-10-15 – 2016-10-17 (×3): 20 mg via ORAL
  Filled 2016-10-15 (×3): qty 1

## 2016-10-15 MED ORDER — DIAZEPAM 5 MG PO TABS: 10.0000 mg | ORAL_TABLET | Freq: Every day | ORAL | Status: DC | PRN

## 2016-10-15 MED ORDER — ACETAMINOPHEN 325 MG PO TABS: 650.0000 mg | ORAL_TABLET | Freq: Four times a day (QID) | ORAL | Status: DC | PRN

## 2016-10-15 MED ORDER — ACETAMINOPHEN 325 MG PO TABS
650.0000 mg | ORAL_TABLET | Freq: Four times a day (QID) | ORAL | Status: DC | PRN
  Administered 2016-10-15 (×2): 650 mg via ORAL
  Filled 2016-10-15 (×2): qty 2

## 2016-10-15 MED ORDER — LOPERAMIDE HCL 2 MG PO CAPS
6.0000 mg | ORAL_CAPSULE | Freq: Two times a day (BID) | ORAL | Status: DC
  Administered 2016-10-15 – 2016-10-17 (×4): 6 mg via ORAL
  Filled 2016-10-15 (×4): qty 3

## 2016-10-15 MED ORDER — SODIUM CHLORIDE 0.9 % IV SOLN
INTRAVENOUS | Status: AC
  Administered 2016-10-15: 02:00:00 via INTRAVENOUS

## 2016-10-15 MED ORDER — PATIROMER SORBITEX CALCIUM 8.4 G PO PACK
8.4000 g | PACK | Freq: Every day | ORAL | Status: DC
  Administered 2016-10-15 – 2016-10-17 (×3): 8.4 g via ORAL
  Filled 2016-10-15 (×3): qty 4

## 2016-10-15 MED ORDER — ALLOPURINOL 100 MG PO TABS
100.0000 mg | ORAL_TABLET | Freq: Every day | ORAL | Status: DC
  Administered 2016-10-15 – 2016-10-17 (×3): 100 mg via ORAL
  Filled 2016-10-15 (×3): qty 1

## 2016-10-15 MED ORDER — INSULIN ASPART 100 UNIT/ML ~~LOC~~ SOLN: 0.0000 [IU] | Freq: Every day | SUBCUTANEOUS | Status: DC

## 2016-10-15 MED ORDER — METOCLOPRAMIDE HCL 5 MG/ML IJ SOLN
5.0000 mg | Freq: Four times a day (QID) | INTRAMUSCULAR | Status: DC | PRN
  Administered 2016-10-15: 5 mg via INTRAVENOUS
  Filled 2016-10-15 (×2): qty 2

## 2016-10-15 MED ORDER — EZETIMIBE 10 MG PO TABS
10.0000 mg | ORAL_TABLET | Freq: Every day | ORAL | Status: DC
  Administered 2016-10-15 – 2016-10-16 (×2): 10 mg via ORAL
  Filled 2016-10-15 (×2): qty 1

## 2016-10-15 MED ORDER — ESCITALOPRAM OXALATE 10 MG PO TABS
10.0000 mg | ORAL_TABLET | Freq: Every day | ORAL | Status: DC
  Administered 2016-10-15 – 2016-10-17 (×3): 10 mg via ORAL
  Filled 2016-10-15 (×3): qty 1

## 2016-10-15 MED ORDER — BACLOFEN 10 MG PO TABS: 10.0000 mg | ORAL_TABLET | Freq: Three times a day (TID) | ORAL | Status: DC | PRN

## 2016-10-15 MED ORDER — TACROLIMUS 1 MG PO CAPS
2.0000 mg | ORAL_CAPSULE | Freq: Two times a day (BID) | ORAL | Status: DC
  Administered 2016-10-15 – 2016-10-17 (×6): 2 mg via ORAL
  Filled 2016-10-15 (×6): qty 2

## 2016-10-15 MED ORDER — TAMSULOSIN HCL 0.4 MG PO CAPS
0.4000 mg | ORAL_CAPSULE | Freq: Every day | ORAL | Status: DC
  Administered 2016-10-15 – 2016-10-17 (×3): 0.4 mg via ORAL
  Filled 2016-10-15 (×3): qty 1

## 2016-10-15 MED ORDER — SODIUM BICARBONATE 650 MG PO TABS
1300.0000 mg | ORAL_TABLET | Freq: Two times a day (BID) | ORAL | Status: DC
  Administered 2016-10-15 – 2016-10-17 (×5): 1300 mg via ORAL
  Filled 2016-10-15 (×5): qty 2

## 2016-10-15 MED ORDER — INSULIN ASPART 100 UNIT/ML ~~LOC~~ SOLN
0.0000 [IU] | Freq: Three times a day (TID) | SUBCUTANEOUS | Status: DC
  Administered 2016-10-15: 2 [IU] via SUBCUTANEOUS
  Administered 2016-10-15 – 2016-10-16 (×2): 3 [IU] via SUBCUTANEOUS
  Administered 2016-10-16 – 2016-10-17 (×4): 2 [IU] via SUBCUTANEOUS

## 2016-10-15 MED ORDER — GABAPENTIN 300 MG PO CAPS
300.0000 mg | ORAL_CAPSULE | Freq: Two times a day (BID) | ORAL | Status: DC
  Administered 2016-10-15 – 2016-10-17 (×5): 300 mg via ORAL
  Filled 2016-10-15 (×5): qty 1

## 2016-10-15 MED ORDER — ONDANSETRON HCL 4 MG/2ML IJ SOLN
4.0000 mg | Freq: Four times a day (QID) | INTRAMUSCULAR | Status: DC | PRN
  Administered 2016-10-15 (×2): 4 mg via INTRAVENOUS
  Filled 2016-10-15 (×2): qty 2

## 2016-10-15 MED ORDER — OXYCODONE HCL 5 MG PO TABS
5.0000 mg | ORAL_TABLET | ORAL | Status: DC | PRN
  Administered 2016-10-15 (×3): 10 mg via ORAL
  Filled 2016-10-15 (×3): qty 2

## 2016-10-15 MED ORDER — ACETAMINOPHEN 650 MG RE SUPP: 650.0000 mg | Freq: Four times a day (QID) | RECTAL | Status: DC | PRN

## 2016-10-15 MED ORDER — INSULIN ASPART 100 UNIT/ML ~~LOC~~ SOLN
6.0000 [IU] | Freq: Three times a day (TID) | SUBCUTANEOUS | Status: DC
  Administered 2016-10-15 – 2016-10-17 (×3): 6 [IU] via SUBCUTANEOUS

## 2016-10-15 NOTE — H&P (Signed)
History and Physical    Allen Schultz ZOX:[id number] DOB: 04-21-1951 DOA: 10/14/2016  PCP: Patient, No Pcp Per   Patient coming from: Home  Chief Complaint: Fevers, malaise   HPI: Allen Schultz is a 66 y.o. male with medical history significant for cryptogenic cirrhosis status post liver transplant in 1996, chronic kidney disease stage III, insulin-dependent diabetes mellitus, and hypertension, now presenting to the emergency department with one of fevers and malaise. Patient reports that he was in his usual state of health until some time today when he developed chills with shakes. He took his temperature, finding it to be 104F. He reports some mild nausea associated with this, but no abdominal pain or vomiting. He reports chronic diarrhea that is stable. Denies any significant cough or dyspnea, denies dysuria or flank pain, and denies any rash or wound. Patient follows with [facility] transplant clinic. No recent long distance travel or sick contacts. Denies sore throat or rhinorrhea.   ED Course: Upon arrival to the ED, patient is found to be febrile to 38.4 C, saturating well on room air, and with vitals otherwise stable. EKG features a sinus rhythm with early transition. Chest x-ray is notable for small bilateral effusions but otherwise clear. Chemistry panel reveals a serum creatinine 1.40 which appears consistent with his baseline. CMP also is notable for mild elevations in alkaline phosphatase, AST, ALT, and total bilirubin. CBC is notable for a mild normocytic anemia with hemoglobin of 12.3 and a new thrombocytopenia with platelets 111,000. Urinalysis is unremarkable. Lactic acid is elevated to 3.38, improving to 2.22 after IV fluids. CT of the abdomen and pelvis was obtained and demonstrates soft tissue inflammation about the right side of the bladder with bladder wall thickening suggestive of possible cystitis versus an underlying mass. Also noted on CT are nonspecific mild inflammatory changes  in the mesentery and about the gallbladder which may be postsurgical. Blood and urine cultures were obtained in the ED, patient was treated with 2 L of normal saline and 2 L lactated Ringer's, and started on empiric vancomycin and Zosyn. He remained hemodynamically stable in the ED and in no apparent respiratory distress. He will be admitted to the medical-surgical unit for ongoing evaluation and management of fevers with rigors and an immunocompromised patient.  Review of Systems:  All other systems reviewed and apart from HPI, are negative.  Past Medical History:  Diagnosis Date  . Colon cancer ([place])   . Diabetes mellitus without complication ([place])   . Heart murmur   . Hypertension   . Liver transplant recipient [facility])     Past Surgical History:  Procedure Laterality Date  . CHOLECYSTECTOMY    . COLON SURGERY    . LIVER TRANSPLANT    . TONSILLECTOMY       reports that he has never smoked. He has never used smokeless tobacco. He reports that he does not drink alcohol or use drugs.  Allergies  Allergen Reactions  . Clinoril [Sulindac] Swelling    Lip swelling  . Statins Other (See Comments)    Due to transplant    History reviewed. No pertinent family history.   Prior to Admission medications   Medication Sig Start Date End Date Taking? Authorizing Provider  allopurinol (ZYLOPRIM) 100 MG tablet Take 100 mg by mouth daily.   Yes [provider]  baclofen (LIORESAL) 10 MG tablet Take 10 mg by mouth 3 (three) times daily as needed for muscle spasms.   Yes [provider]  colchicine 0.6 MG  tablet Take 0.6 mg by mouth daily as needed (gout).   Yes [provider]  diazepam (VALIUM) 10 MG tablet Take 10 mg by mouth daily as needed (muscle spasms).   Yes [provider]  escitalopram (LEXAPRO) 20 MG tablet Take 10 mg by mouth daily as needed (depression).   Yes [provider]  ezetimibe (ZETIA) 10 MG tablet Take 10 mg by mouth daily.    Yes [provider]  gabapentin (NEURONTIN) 100 MG capsule Take 300 mg by mouth 2 (two) times daily.   Yes [provider]  insulin lispro protamine-lispro (HUMALOG 75/25 MIX) (75-25) 100 UNIT/ML SUSP injection Inject 20-30 Units into the skin 2 (two) times daily before a meal.   Yes [provider]  loperamide (IMODIUM) 2 MG capsule Take 6 mg by mouth 2 (two) times daily.   Yes [provider]  mupirocin ointment (BACTROBAN) 2 % Apply 1 application topically 2 (two) times daily as needed (wound care).   Yes [provider]  nebivolol (BYSTOLIC) 5 MG tablet Take 5 mg by mouth daily.   Yes [provider]  patiromer (VELTASSA) 8.4 g packet Take 8.4 g by mouth 2 (two) times daily. Dissolve packet in water and drink   Yes [provider]  promethazine (PHENERGAN) 25 MG tablet Take 25 mg by mouth every 6 (six) hours as needed for nausea or vomiting.   Yes [provider]  ranitidine (ZANTAC) 150 MG tablet Take 150 mg by mouth daily.   Yes [provider]  sodium bicarbonate 650 MG tablet Take 1,300 mg by mouth 2 (two) times daily.   Yes [provider]  Sodium Fluoride (PREVIDENT 5000 BOOSTER PLUS) 1.1 % PSTE Place 1 application onto teeth 2 (two) times daily.   Yes [provider]  tacrolimus (PROGRAF) 1 MG capsule Take 2 mg by mouth 2 (two) times daily.   Yes [provider]  tamsulosin (FLOMAX) 0.4 MG CAPS capsule Take 0.4 mg by mouth daily.   Yes [provider]  traMADol (ULTRAM) 50 MG tablet Take 50 mg by mouth 2 (two) times daily as needed (pain).   Yes [provider]    Physical Exam: Vitals:   10/14/16 2300 10/14/16 2330 10/15/16 0030 10/15/16 0045  BP: (!) 148/62 (!) 157/66 (!) 151/58   Pulse: 68 74 72   Resp:      Temp:    (!) 102.9 F (39.4 C)  TempSrc:    (S) Oral  SpO2: 97% 97% 96%   Weight:      Height:          Constitutional: No respiratory  distress. In apparent discomfort.  Eyes: PERTLA, lids and conjunctivae normal ENMT: Mucous membranes are moist. Posterior pharynx clear of any exudate or lesions.   Neck: normal, supple, no masses, no thyromegaly Respiratory: clear to auscultation bilaterally, no wheezing, no crackles. Normal respiratory effort.    Cardiovascular: S1 & S2 heard, regular rate and rhythm, Soft systolic murmur. 1+ pretibial edema bilaterally. No significant JVD. Abdomen: No distension, no tenderness, no masses palpated. Bowel sounds active.  Musculoskeletal: no clubbing / cyanosis. No joint deformity upper and lower extremities.   Skin: no significant rashes, lesions, ulcers. Warm, dry, well-perfused. Neurologic: CN 2-12 grossly intact. Sensation intact, DTR normal. Strength 5/5 in all 4 limbs.  Psychiatric: Alert and oriented x 3. Calm and cooperative.     Labs on Admission: I have personally reviewed following labs and imaging studies  CBC:  Recent Labs Lab 10/14/16 1920  WBC 9.4  NEUTROABS 7.3  HGB 12.3*  HCT 37.8*  MCV 90.6  PLT 811*   Basic Metabolic Panel:  Recent Labs Lab 10/14/16 1920  NA 137  K 5.1  CL 106  CO2 23  GLUCOSE 121*  BUN 16  CREATININE 1.40*  CALCIUM 9.3   GFR: Estimated Creatinine Clearance: 67.9 mL/min (A) (by C-G formula based on SCr of 1.4 mg/dL (H)). Liver Function Tests:  Recent Labs Lab 10/14/16 1920  AST 158*  ALT 84*  ALKPHOS 251*  BILITOT 2.1*  PROT 6.8  ALBUMIN 3.7   No results for input(s): LIPASE, AMYLASE in the last 168 hours. No results for input(s): AMMONIA in the last 168 hours. Coagulation Profile: No results for input(s): INR, PROTIME in the last 168 hours. Cardiac Enzymes: No results for input(s): CKTOTAL, CKMB, CKMBINDEX, TROPONINI in the last 168 hours. BNP (last 3 results) No results for input(s): PROBNP in the last 8760 hours. HbA1C: No results for input(s): HGBA1C in the last 72 hours. CBG:  Recent Labs Lab 10/14/16 2258    GLUCAP 114*   Lipid Profile: No results for input(s): CHOL, HDL, LDLCALC, TRIG, CHOLHDL, LDLDIRECT in the last 72 hours. Thyroid Function Tests: No results for input(s): TSH, T4TOTAL, FREET4, T3FREE, THYROIDAB in the last 72 hours. Anemia Panel: No results for input(s): VITAMINB12, FOLATE, FERRITIN, TIBC, IRON, RETICCTPCT in the last 72 hours. Urine analysis:    Component Value Date/Time   COLORURINE YELLOW 10/14/2016 2040   APPEARANCEUR CLEAR 10/14/2016 2040   LABSPEC 1.009 10/14/2016 2040   PHURINE 5.0 10/14/2016 2040   GLUCOSEU NEGATIVE 10/14/2016 2040   HGBUR NEGATIVE 10/14/2016 2040   BILIRUBINUR NEGATIVE 10/14/2016 2040   KETONESUR NEGATIVE 10/14/2016 2040   PROTEINUR NEGATIVE 10/14/2016 2040   NITRITE NEGATIVE 10/14/2016 2040   LEUKOCYTESUR NEGATIVE 10/14/2016 2040   Sepsis Labs: @LABRCNTIP (procalcitonin:4,lacticidven:4) )No results found for this or any previous visit (from the past 240 hour(s)).   Radiological Exams on Admission: Dg Chest 2 View  Result Date: 10/14/2016 CLINICAL DATA:  Acute onset of fever and diaphoresis. Initial encounter. EXAM: CHEST  2 VIEW COMPARISON:  None. FINDINGS: The lungs are well-aerated. Pulmonary vascularity is at the upper limits of normal. Small bilateral pleural effusions are suggested on the lateral view. There is no evidence of focal opacification or pneumothorax. A rounded density at the periphery of the right lung base appears to reflect a remote healed rib fracture. The heart is normal in size; the mediastinal contour is within normal limits. No acute osseous abnormalities are seen. IMPRESSION: Suggestion of small bilateral pleural effusions. Lungs otherwise grossly clear. Electronically Signed   By: Garald Balding M.D.   On: 10/14/2016 20:33   Ct Abdomen Pelvis W Contrast  Result Date: 10/14/2016 CLINICAL DATA:  Acute onset of generalized abdominal pain. Initial encounter. EXAM: CT ABDOMEN AND PELVIS WITH CONTRAST TECHNIQUE:  Multidetector CT imaging of the abdomen and pelvis was performed using the standard protocol following bolus administration of intravenous contrast. CONTRAST:  148mL ISOVUE-300 IOPAMIDOL (ISOVUE-300) INJECTION 61% COMPARISON:  None. FINDINGS: Lower chest: Diffuse coronary artery calcifications are seen. Minimal right basilar atelectasis or scarring is noted. A slightly prominent 9 mm epicardial fat pad node is noted. Hepatobiliary: Scattered pneumobilia is noted. The transplant liver is grossly unremarkable in appearance. The patient is status post cholecystectomy. Vague soft tissue inflammation is seen tracking about the liver and gallbladder fossa, possibly reflecting postoperative change. Pancreas: Mildly prominent peripancreatic nodes  measure up to 1.3 cm in short axis. The pancreas is grossly unremarkable. Spleen: The spleen is enlarged, measuring 19.1 cm in length. Adrenals/Urinary Tract: The adrenal glands are grossly unremarkable. Nonspecific perinephric stranding is noted bilaterally. There is no evidence of hydronephrosis. No renal or ureteral stones are identified. There is slight asymmetric prominence of the right ureter. Stomach/Bowel: The patient is status post resection of much of the colon. The ileocolic anastomosis at the lower mid abdomen is grossly unremarkable. The small bowel is grossly unremarkable. The stomach is largely decompressed and grossly unremarkable. There is mild nonspecific inflammation at the proximal mesentery. Vascular/Lymphatic: Scattered calcification is seen along the abdominal aorta and its branches. The abdominal aorta is otherwise grossly unremarkable. The inferior vena cava is grossly unremarkable. No retroperitoneal lymphadenopathy is seen. No pelvic sidewall lymphadenopathy is identified. Scattered prominent nodes are noted about the IVC, measuring up to 1.0 cm in short axis. A mildly prominent 1.2 cm mesenteric node is noted. No pelvic sidewall lymphadenopathy is seen.  Reproductive: Soft tissue inflammation is noted about the right side of the bladder, with associated right-sided wall thickening. This may reflect cystitis, though underlying mass cannot be excluded. The prostate remains normal in size. Other: No additional soft tissue abnormalities are seen. Musculoskeletal: No acute osseous abnormalities are identified. Multilevel vacuum phenomenon is noted along the thoracic and lumbar spine. There is mild chronic loss of height at vertebral body L1. The visualized musculature is unremarkable in appearance. IMPRESSION: 1. Soft tissue inflammation about the right side of the bladder, with associated right-sided wall thickening. This may reflect acute cystitis, though underlying mass cannot be excluded. Cystoscopy could be considered for further evaluation. 2. Transplant liver is grossly unremarkable. Underlying mild pneumobilia noted. Vague soft tissue inflammation about the liver and gallbladder fossa may reflect postoperative change. 3. Significant splenomegaly. 4. Mild nonspecific inflammation at the proximal mesentery. Prominent retroperitoneal nodes noted about the IVC and pancreas, and mildly prominent 1.2 cm mesenteric node seen. These are of uncertain significance. 5. Diffuse coronary artery calcifications seen. 6. Ileocolic anastomosis is grossly unremarkable in appearance. 7. Mild chronic loss of height at vertebral body L1. Electronically Signed   By: Garald Balding M.D.   On: 10/14/2016 23:48    EKG: Independently reviewed. Sinus rhythm, early R-transition.   Assessment/Plan  1. Fever, rigors - Pt presents with 1 day of rigors, temp 104F at home - No localizing s/s; UA unremarkable, CXR without consolidation, CT abd/pelvis with non-specific inflammatory change in mesentery and focal bladder wall-thickening with normal UA   - Lactic acid elevated, improving with IVF  - Blood and urine cultures were obtained in ED, 4 liters of IVF given, and empiric  vancomycin and Zosyn initiated  - Plan to continue broad-spectrum abx while following cultures and clinical course    2. Liver transplant recipient  - Pt received liver transplant in 1996 for cryptogenic cirrhosis, followed at [facility] transplant clinic - Mild elevations in transaminases and bilirubin noted, slightly higher than priors, without RUQ tenderness, likely secondary to acute febrile illness with dehydration  - Continue Prograf    3. Bladder-wall thickening  - Noted on CT, possibly reflecting cystitis, though UA not suggestive of infection; urine sent for culture  - May need cystoscopy for further evaluation  - Follow urine culture, continue abx as above   4. Thrombocytopenia  - There is a new thrombocytopenia to 111k on admission  - Suspected secondary to acute infection, no sign of bleeding  - Repeat CBC  in am    5. CKD stage III  - SCr is 1.40 on admission, consistent with apparent baseline  - Avoid nephrotoxins where feasible, follow chem panels while on IVF   6. Depression, anxiety  - Stable, continue Lexapro and prn Valium   7. Insulin-dependent DM  - A1c was 6.6% in May 2018  - Managed at home with Humalog 75/25 20-30 units BID  - Check CBG with meals and qHS  - Start Novolog 6 units TID with meals, and per moderate-intensity sliding-scale    DVT prophylaxis: sq heparin Code Status: Full  Family Communication: Discussed with patient Disposition Plan: Admit to med-surg Consults called: None Admission status: Inpatient    Vianne Bulls, MD [facility] Pager [phone]  If 7PM-7AM, please contact night-coverage [email or web address] Password [email or web address]  10/15/2016, 1:08 AM

## 2016-10-15 NOTE — ED Notes (Signed)
This nurse called pharmacy to verify pt medications in order for this nurse to give tylenol for pt fever and oxycodone for pt HA. Despite pharmacy verifying medications, this nurse unable to pull from ED pyxis. This nurse notified 6E of delay in medication administration.

## 2016-10-15 NOTE — ED Notes (Signed)
Admitting paged regarding pt temperature.

## 2016-10-15 NOTE — Progress Notes (Signed)
1:00 PM I agree with HPI/GPe and A/P per Dr. Myna Hidalgo      66 y/o ?  status post liver transplant 1996   On tacrolimus 2 mg twice a day Chronic hyperkalemia History of cranial neuropathy, low back pain, diabetic neuropathy Recurrent major depression in partial remission Gout Chronic kidney disease stage III Insulin-dependent diabetes mellitus HTN  Admitted early a.m. 6 23 2  [facility] shaking chills right hours mild nausea Also has chronic diarrhea  Thrombocytopenia 111,000, lactic acid 3.3-->2.2 CT abdomen? Cystitis versus mass  Admitted for possible urinary tract infection in the setting of immunocompromise and on Prograf  Urine culture, blood culture. [pending    HEENT obese pleasant no ict, slight dischevlled CHEST clear CARDIAC s1 s 2no m/r/g ABDOMEN slight tender, BS + NEURO no focal signs nor deficti SKIN/MUSCULAR no le edema  Patient Active Problem List   Diagnosis Date Noted  . Liver transplant recipient [facility]) 10/15/2016  . Immunosuppressed status ([place]) 10/15/2016  . CKD (chronic kidney disease), stage III 10/15/2016  . Fever and chills 10/15/2016  . Bladder wall thickening 10/15/2016  . Thrombocytopenia ([place]) 10/15/2016  . Depression with anxiety 10/15/2016  . Diabetes mellitus, type II, insulin dependent ([place]) 10/15/2016  . Immunocompromised ([place])      P cont Abx Most likely has a cystitis, If hi fever sand chills, get US kidney in am and d/w Urology--Mass in bladder?

## 2016-10-16 LAB — URINE CULTURE
Culture: NO GROWTH
SPECIAL REQUESTS: NORMAL

## 2016-10-16 LAB — GASTROINTESTINAL PANEL BY PCR, STOOL (REPLACES STOOL CULTURE)

## 2016-10-16 LAB — GLUCOSE, CAPILLARY
GLUCOSE-CAPILLARY: 179 mg/dL — AB (ref 65–99)
GLUCOSE-CAPILLARY: 123 mg/dL — AB (ref 65–99)
GLUCOSE-CAPILLARY: 129 mg/dL — AB (ref 65–99)
Glucose-Capillary: 122 mg/dL — ABNORMAL HIGH (ref 65–99)

## 2016-10-16 LAB — C DIFFICILE QUICK SCREEN W PCR REFLEX
C Diff antigen: POSITIVE — AB
C Diff toxin: NEGATIVE

## 2016-10-16 LAB — COMPREHENSIVE METABOLIC PANEL
ALK PHOS: 192 U/L — AB (ref 38–126)
ALT: 47 U/L (ref 17–63)
AST: 51 U/L — AB (ref 15–41)
Albumin: 3 g/dL — ABNORMAL LOW (ref 3.5–5.0)
Anion gap: 4 — ABNORMAL LOW (ref 5–15)
BILIRUBIN TOTAL: 1.5 mg/dL — AB (ref 0.3–1.2)
BUN: 12 mg/dL (ref 6–20)
CALCIUM: 9 mg/dL (ref 8.9–10.3)
CHLORIDE: 105 mmol/L (ref 101–111)
CO2: 25 mmol/L (ref 22–32)
CREATININE: 1.38 mg/dL — AB (ref 0.61–1.24)
GFR, EST NON AFRICAN AMERICAN: 52 mL/min — AB (ref >60)
Glucose, Bld: 96 mg/dL (ref 65–99)
Potassium: 4 mmol/L (ref 3.5–5.1)
Sodium: 134 mmol/L — ABNORMAL LOW (ref 135–145)
Total Protein: 6.2 g/dL — ABNORMAL LOW (ref 6.5–8.1)

## 2016-10-16 LAB — CLOSTRIDIUM DIFFICILE BY PCR: Toxigenic C. Difficile by PCR: NEGATIVE

## 2016-10-16 LAB — TACROLIMUS LEVEL: TACROLIMUS (FK506) - LABCORP: 7.2 ng/mL (ref 2.0–20.0)

## 2016-10-16 NOTE — Progress Notes (Signed)
Addend D/w Dr. Paulita Fujita rising bili and tranaminiases--my concern is possible sub-acute liver tranplant rejection Follow cmet Night coverage to be notified and I will consult GI formally in am if worseining labs

## 2016-10-16 NOTE — Progress Notes (Signed)
PROGRESS NOTE    Allen Schultz  ZYY:[id number] DOB: 09/14/1950 DOA: 10/14/2016 PCP: Patient, No Pcp Per  Outpatient Specialists:     Brief Narrative:   66 y/o ?  status post liver transplant 1996              On tacrolimus 2 mg twice a day History end-end ileocolonic anastomosis sigmoid colon, last scope 03/12/2014 Chronic hyperkalemia History of cranial neuropathy, low back pain, diabetic neuropathy Recurrent major depression in partial remission Gout Chronic kidney disease stage III Insulin-dependent diabetes mellitus HTN  Admitted early a.m. 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 Cystitis versus mass  Admitted for possible urinary tract infection in the setting of immunocompromise and on Prograf  Urine culture neg, blood culture ngtd Cdiff neg GI pathogen panel pending   Assessment & Plan:   Principal Problem:   Fever and chills Active Problems:   Liver transplant recipient [facility])   Immunosuppressed status ([place])   CKD (chronic kidney disease), stage III   Bladder wall thickening   Thrombocytopenia (H[facility])   Depression with anxiety   Diabetes mellitus, type II, insulin dependent (HCC)   Immunocompromised ([place])   Sepsis without source, possible viral illness/gastroenteritis  On admission lactic acid 3.3--->0.9 currently  C. difficile is negative await GI pathogen panel  Blood culture urine culture unremarkable  Continue Zosyn and vancomycin for now, feel reasonable to discontinue vancomycin if MRSA screen is negative  CT scan abdomen pelvis shows? Mass in the abdomen however patient has had numerous abdominal surgeries and CT scan is not the most reliable modality for bladder. We will hold on further imaging however at this time continue treatment with Rocephin as he is clinically markedly improved  and continue IV saline for now  Acute kidney injury Chronic hyperkalemia Chronic metabolic acidosis  Continue Veltassa 8.4 mg daily, potassium is 5  Baseline creatinine varies from 1.3-1.6 in care everywhere, currently trended down from 1.4-1.3  Continue sodium bicarbonate 1.3 g twice a day  History of liver transplant 1996  Continue tacrolimus 2 mg twice a day, levels are not supratherapeutic and are between 2 and 20 range  at 7 Thrombocytopenia + Mild transaminitis  His baseline liver function is relatively normal with only slight increase in AST to about 60, his baseline  platelet count is in the 170s and is now 104  This could be related to either his infection--could this be sub-acute transplant rejection?  we will get platelet smear  His HIV is negative  Insulin-dependent diabetes mellitus  Sugars ranging 120 221 79 on sliding scale  Had home on 7030 insulin 20-30 twice a day, would hold for now as it has only just started eating  Continue gabapentin 300 3 times a day  Gout  Holding colchicine 0.6 mg for now, continue allopurinol 100 daily  Hypertension  Continue nebivolol 5 daily, controlled moderately   Iatrogenic diarrhea in setting of end to end anastomosis  Continue Lomotil, not having any diarrhea present and this improved   Antimicrobials:   Vancomycin, Zosyn 6/24    Subjective: Awake alert no new issues Much better eating drinking No further nausea   Objective: Vitals:   10/15/16 2039 10/15/16 2218 10/16/16 0522 10/16/16 0927  BP: (!) 169/48 (!) 136/51 (!) 146/64 (!) 154/60  Pulse: 72 (!) 56 (!) 58 (!) 57  Resp: 20 18 18  18  Temp: 98.1 F (36.7 C) 98.1 F (36.7 C) 97.9 F (36.6 C) 98.4 F (36.9 C)  TempSrc: Oral Oral Oral Oral  SpO2: 93% 97% 95% 99%  Weight: 96.2 kg (212 lb) 122 kg (269 lb)    Height:        Intake/Output Summary (Last 24 hours) at 10/16/16 1450 Last data filed at 10/16/16 0600  Gross per 24 hour  Intake              540 ml   Output              850 ml  Net             -310 ml   Filed Weights   10/15/16 0139 10/15/16 2039 10/15/16 2218  Weight: 122 kg (269 lb) 96.2 kg (212 lb) 122 kg (269 lb)    Examination:  EOMI NCAT obese pleasant Chest clear no blurred nor double vision Abdomen soft nontender nondistended no rebound no guarding No lower extremity edema Cranial nerves intact No rash Cannot appreciate organomegaly given habitus    Data Reviewed: I have personally reviewed following labs and imaging studies  CBC:  Recent Labs Lab 10/14/16 1920 10/15/16 0227  WBC 9.4 8.6  NEUTROABS 7.3  --   HGB 12.3* 12.3*  HCT 37.8* 37.7*  MCV 90.6 90.2  PLT 111* 254*   Basic Metabolic Panel:  Recent Labs Lab 10/14/16 1920 10/15/16 0227  NA 137 135  K 5.1 5.0  CL 106 106  CO2 23 21*  GLUCOSE 121* 124*  BUN 16 14  CREATININE 1.40* 1.30*  CALCIUM 9.3 8.8*   GFR: Estimated Creatinine Clearance: 74.2 mL/min (A) (by C-G formula based on SCr of 1.3 mg/dL (H)). Liver Function Tests:  Recent Labs Lab 10/14/16 1920 10/15/16 0227  AST 158* 124*  ALT 84* 74*  ALKPHOS 251* 215*  BILITOT 2.1* 4.3*  PROT 6.8 6.7  ALBUMIN 3.7 3.4*   No results for input(s): LIPASE, AMYLASE in the last 168 hours. No results for input(s): AMMONIA in the last 168 hours. Coagulation Profile:  Recent Labs Lab 10/15/16 0227  INR 1.17   Cardiac Enzymes: No results for input(s): CKTOTAL, CKMB, CKMBINDEX, TROPONINI in the last 168 hours. BNP (last 3 results) No results for input(s): PROBNP in the last 8760 hours. HbA1C: No results for input(s): HGBA1C in the last 72 hours. CBG:  Recent Labs Lab 10/15/16 1137 10/15/16 1631 10/15/16 2217 10/16/16 0808 10/16/16 1212  GLUCAP 112* 155* 140* 122* 179*   Lipid Profile: No results for input(s): CHOL, HDL, LDLCALC, TRIG, CHOLHDL, LDLDIRECT in the last 72 hours. Thyroid Function Tests: No results for input(s): TSH, T4TOTAL, FREET4, T3FREE, THYROIDAB in the  last 72 hours. Anemia Panel: No results for input(s): VITAMINB12, FOLATE, FERRITIN, TIBC, IRON, RETICCTPCT in the last 72 hours. Urine analysis:    Component Value Date/Time   COLORURINE YELLOW 10/14/2016 2040   APPEARANCEUR CLEAR 10/14/2016 2040   LABSPEC 1.009 10/14/2016 2040   PHURINE 5.0 10/14/2016 2040   GLUCOSEU NEGATIVE 10/14/2016 2040   HGBUR NEGATIVE 10/14/2016 2040   BILIRUBINUR NEGATIVE 10/14/2016 2040   KETONESUR NEGATIVE 10/14/2016 2040   PROTEINUR NEGATIVE 10/14/2016 2040   NITRITE NEGATIVE 10/14/2016 2040   LEUKOCYTESUR NEGATIVE 10/14/2016 2040   Sepsis Labs: @LABRCNTIP (procalcitonin:4,lacticidven:4)  ) Recent Results (from the past 240 hour(s))  Blood culture (routine x 2)     Status: None (Preliminary result)   Collection Time: 10/14/16  7:50 PM  Result Value  Ref Range Status   Specimen Description BLOOD RIGHT ANTECUBITAL  Final   Special Requests   Final    BOTTLES DRAWN AEROBIC AND ANAEROBIC Blood Culture adequate volume   Culture NO GROWTH 2 DAYS  Final   Report Status PENDING  Incomplete  Urine culture     Status: None   Collection Time: 10/14/16  8:40 PM  Result Value Ref Range Status   Specimen Description URINE, CLEAN CATCH  Final   Special Requests Normal  Final   Culture NO GROWTH  Final   Report Status 10/16/2016 FINAL  Final  Blood culture (routine x 2)     Status: None (Preliminary result)   Collection Time: 10/15/16  2:27 AM  Result Value Ref Range Status   Specimen Description BLOOD RIGHT ANTECUBITAL  Final   Special Requests IN PEDIATRIC BOTTLE Blood Culture adequate volume  Final   Culture NO GROWTH 1 DAY  Final   Report Status PENDING  Incomplete  C difficile quick scan w PCR reflex     Status: Abnormal   Collection Time: 10/15/16  6:45 PM  Result Value Ref Range Status   C Diff antigen POSITIVE (A) NEGATIVE Final   C Diff toxin NEGATIVE NEGATIVE Final   C Diff interpretation Results are indeterminate. See PCR results.  Final    Clostridium Difficile by PCR     Status: None   Collection Time: 10/15/16  6:45 PM  Result Value Ref Range Status   Toxigenic C Difficile by pcr NEGATIVE NEGATIVE Final    Comment: Patient is colonized with non toxigenic C. difficile. May not need treatment unless significant symptoms are present.         Radiology Studies: Dg Chest 2 View  Result Date: 10/14/2016 CLINICAL DATA:  Acute onset of fever and diaphoresis. Initial encounter. EXAM: CHEST  2 VIEW COMPARISON:  None. FINDINGS: The lungs are well-aerated. Pulmonary vascularity is at the upper limits of normal. Small bilateral pleural effusions are suggested on the lateral view. There is no evidence of focal opacification or pneumothorax. A rounded density at the periphery of the right lung base appears to reflect a remote healed rib fracture. The heart is normal in size; the mediastinal contour is within normal limits. No acute osseous abnormalities are seen. IMPRESSION: Suggestion of small bilateral pleural effusions. Lungs otherwise grossly clear. Electronically Signed   By: Garald Balding M.D.   On: 10/14/2016 20:33   Ct Abdomen Pelvis W Contrast  Result Date: 10/14/2016 CLINICAL DATA:  Acute onset of generalized abdominal pain. Initial encounter. EXAM: CT ABDOMEN AND PELVIS WITH CONTRAST TECHNIQUE: Multidetector CT imaging of the abdomen and pelvis was performed using the standard protocol following bolus administration of intravenous contrast. CONTRAST:  179mL ISOVUE-300 IOPAMIDOL (ISOVUE-300) INJECTION 61% COMPARISON:  None. FINDINGS: Lower chest: Diffuse coronary artery calcifications are seen. Minimal right basilar atelectasis or scarring is noted. A slightly prominent 9 mm epicardial fat pad node is noted. Hepatobiliary: Scattered pneumobilia is noted. The transplant liver is grossly unremarkable in appearance. The patient is status post cholecystectomy. Vague soft tissue inflammation is seen tracking about the liver and  gallbladder fossa, possibly reflecting postoperative change. Pancreas: Mildly prominent peripancreatic nodes measure up to 1.3 cm in short axis. The pancreas is grossly unremarkable. Spleen: The spleen is enlarged, measuring 19.1 cm in length. Adrenals/Urinary Tract: The adrenal glands are grossly unremarkable. Nonspecific perinephric stranding is noted bilaterally. There is no evidence of hydronephrosis. No renal or ureteral stones are identified. There is slight  asymmetric prominence of the right ureter. Stomach/Bowel: The patient is status post resection of much of the colon. The ileocolic anastomosis at the lower mid abdomen is grossly unremarkable. The small bowel is grossly unremarkable. The stomach is largely decompressed and grossly unremarkable. There is mild nonspecific inflammation at the proximal mesentery. Vascular/Lymphatic: Scattered calcification is seen along the abdominal aorta and its branches. The abdominal aorta is otherwise grossly unremarkable. The inferior vena cava is grossly unremarkable. No retroperitoneal lymphadenopathy is seen. No pelvic sidewall lymphadenopathy is identified. Scattered prominent nodes are noted about the IVC, measuring up to 1.0 cm in short axis. A mildly prominent 1.2 cm mesenteric node is noted. No pelvic sidewall lymphadenopathy is seen. Reproductive: Soft tissue inflammation is noted about the right side of the bladder, with associated right-sided wall thickening. This may reflect cystitis, though underlying mass cannot be excluded. The prostate remains normal in size. Other: No additional soft tissue abnormalities are seen. Musculoskeletal: No acute osseous abnormalities are identified. Multilevel vacuum phenomenon is noted along the thoracic and lumbar spine. There is mild chronic loss of height at vertebral body L1. The visualized musculature is unremarkable in appearance. IMPRESSION: 1. Soft tissue inflammation about the right side of the bladder, with  associated right-sided wall thickening. This may reflect acute cystitis, though underlying mass cannot be excluded. Cystoscopy could be considered for further evaluation. 2. Transplant liver is grossly unremarkable. Underlying mild pneumobilia noted. Vague soft tissue inflammation about the liver and gallbladder fossa may reflect postoperative change. 3. Significant splenomegaly. 4. Mild nonspecific inflammation at the proximal mesentery. Prominent retroperitoneal nodes noted about the IVC and pancreas, and mildly prominent 1.2 cm mesenteric node seen. These are of uncertain significance. 5. Diffuse coronary artery calcifications seen. 6. Ileocolic anastomosis is grossly unremarkable in appearance. 7. Mild chronic loss of height at vertebral body L1. Electronically Signed   By: Garald Balding M.D.   On: 10/14/2016 23:48        Scheduled Meds: . allopurinol  100 mg Oral Daily  . escitalopram  10 mg Oral Daily  . ezetimibe  10 mg Oral Daily  . famotidine  20 mg Oral Daily  . gabapentin  300 mg Oral BID  . heparin  5,000 Units Subcutaneous Q8H  . insulin aspart  0-15 Units Subcutaneous TID WC  . insulin aspart  0-5 Units Subcutaneous QHS  . insulin aspart  6 Units Subcutaneous TID WC  . loperamide  6 mg Oral BID  . nebivolol  5 mg Oral Daily  . patiromer  8.4 g Oral Daily  . sodium bicarbonate  1,300 mg Oral BID  . tacrolimus  2 mg Oral BID  . tamsulosin  0.4 mg Oral Daily   Continuous Infusions: . piperacillin-tazobactam (ZOSYN)  IV Stopped (10/16/16 1405)  . vancomycin Stopped (10/16/16 1114)     LOS: 1 day    Time spent: [place], MD [facility]   If 7PM-7AM, please contact night-coverage [email or web address] Password [email or web address] 10/16/2016, 2:50 PM

## 2016-10-16 NOTE — Progress Notes (Signed)
Patient results from [facility] now available from STAT CMP draw.  MD notified at MDs request.  Jillyn Ledger, MBA, BSN, RN

## 2016-10-17 LAB — COMPREHENSIVE METABOLIC PANEL
ALT: 38 U/L (ref 17–63)
ANION GAP: 5 (ref 5–15)
AST: 38 U/L (ref 15–41)
Albumin: 2.8 g/dL — ABNORMAL LOW (ref 3.5–5.0)
Alkaline Phosphatase: 167 U/L — ABNORMAL HIGH (ref 38–126)
BUN: 12 mg/dL (ref 6–20)
CHLORIDE: 106 mmol/L (ref 101–111)
CO2: 26 mmol/L (ref 22–32)
Calcium: 8.6 mg/dL — ABNORMAL LOW (ref 8.9–10.3)
Creatinine, Ser: 1.37 mg/dL — ABNORMAL HIGH (ref 0.61–1.24)
GFR calc non Af Amer: 53 mL/min — ABNORMAL LOW (ref >60)
Glucose, Bld: 146 mg/dL — ABNORMAL HIGH (ref 65–99)
POTASSIUM: 3.9 mmol/L (ref 3.5–5.1)
Sodium: 137 mmol/L (ref 135–145)
Total Bilirubin: 1.3 mg/dL — ABNORMAL HIGH (ref 0.3–1.2)
Total Protein: 5.5 g/dL — ABNORMAL LOW (ref 6.5–8.1)

## 2016-10-17 LAB — CBC WITH DIFFERENTIAL/PLATELET
Basophils Absolute: 0 10*3/uL (ref 0.0–0.1)
Basophils Relative: 1 %
EOS ABS: 0.4 10*3/uL (ref 0.0–0.7)
Eosinophils Relative: 8 %
HCT: 31.2 % — ABNORMAL LOW (ref 39.0–52.0)
HEMOGLOBIN: 10.2 g/dL — AB (ref 13.0–17.0)
LYMPHS ABS: 1.9 10*3/uL (ref 0.7–4.0)
LYMPHS PCT: 37 %
MCH: 29.2 pg (ref 26.0–34.0)
MCHC: 32.7 g/dL (ref 30.0–36.0)
MCV: 89.4 fL (ref 78.0–100.0)
Monocytes Absolute: 0.6 10*3/uL (ref 0.1–1.0)
Monocytes Relative: 11 %
NEUTROS ABS: 2.2 10*3/uL (ref 1.7–7.7)
NEUTROS PCT: 43 %
Platelets: 87 10*3/uL — ABNORMAL LOW (ref 150–400)
RBC: 3.49 MIL/uL — AB (ref 4.22–5.81)
RDW: 14.4 % (ref 11.5–15.5)
WBC: 5.1 10*3/uL (ref 4.0–10.5)

## 2016-10-17 LAB — GLUCOSE, CAPILLARY
Glucose-Capillary: 122 mg/dL — ABNORMAL HIGH (ref 65–99)
Glucose-Capillary: 136 mg/dL — ABNORMAL HIGH (ref 65–99)

## 2016-10-17 LAB — PROTIME-INR
INR: 1.17
PROTHROMBIN TIME: 15 s (ref 11.4–15.2)

## 2016-10-17 NOTE — Progress Notes (Signed)
Pharmacy Antibiotic Note Allen Schultz is a 66 y.o. male admitted on 10/14/2016 with fever and night sweats. Pt has hx of liver transplant and is on antirejection medications. Pharmacy consulted for Zosyn dosing. He is afebrile, WBC are normal, cultures are neg thus far.   Plan: -Continue Zosyn 3.375 g IV q8h (infused over 4 hrs) -Pharmacy will sign off but monitor peripherally -F/U CMV PCR results - lab states will result tomorrow    Height: 5\' 10"  (177.8 cm) Weight: 266 lb 6.4 oz (120.8 kg) IBW/kg (Calculated) : 73  Temp (24hrs), Avg:98.3 F (36.8 C), Min:98 F (36.7 C), Max:98.4 F (36.9 C)   Recent Labs Lab 10/14/16 1920 10/14/16 1929 10/14/16 2258 10/15/16 0227 10/15/16 0456 10/16/16 1752 10/17/16 0521  WBC 9.4  --   --  8.6  --   --  5.1  CREATININE 1.40*  --   --  1.30*  --  1.38* 1.37*  LATICACIDVEN  --  3.38* 2.22* 1.4 0.9  --   --     Estimated Creatinine Clearance: 70 mL/min (A) (by C-G formula based on SCr of 1.37 mg/dL (H)).    Allergies  Allergen Reactions  . Clinoril [Sulindac] Swelling    Lip swelling  . Statins Other (See Comments)    Due to transplant    Antimicrobials this admission: 6/22 zosyn > 6/22 vancomycin > 6/25   Dose adjustments this admission: N/A   Microbiology results: 6/22 Blood cx: ngtd 6/22 UCx: no growth 6/23 C diff PCR: negative 6/23 GI panel: neg 6/25 CMV PCR: pending   Allen Schultz, PharmD, BCPS Clinical Pharmacist Phone for today - Allen Schultz - [phone] 10/17/2016 12:55 PM

## 2016-10-17 NOTE — Progress Notes (Signed)
Allen Schultz Reason to be [name] Prescriptions given to patient, medication list explained in detail. Pt verbalized understanding.  Allergies as of 10/17/2016      Reactions   Clinoril [sulindac] Swelling   Lip swelling   Statins Other (See Comments)   Due to transplant      Medication List    TAKE these medications   allopurinol 100 MG tablet Commonly known as:  ZYLOPRIM Take 100 mg by mouth daily.   baclofen 10 MG tablet Commonly known as:  LIORESAL Take 10 mg by mouth 3 (three) times daily as needed for muscle spasms.   colchicine 0.6 MG tablet Take 0.6 mg by mouth daily as needed (gout).   diazepam 10 MG tablet Commonly known as:  VALIUM Take 10 mg by mouth daily as needed (muscle spasms).   escitalopram 20 MG tablet Commonly known as:  LEXAPRO Take 10 mg by mouth daily as needed (depression).   ezetimibe 10 MG tablet Commonly known as:  ZETIA Take 10 mg by mouth daily.   gabapentin 100 MG capsule Commonly known as:  NEURONTIN Take 300 mg by mouth 2 (two) times daily.   insulin lispro protamine-lispro (75-25) 100 UNIT/ML Susp injection Commonly known as:  HUMALOG 75/25 MIX Inject 20-30 Units into the skin 2 (two) times daily before a meal.   loperamide 2 MG capsule Commonly known as:  IMODIUM Take 6 mg by mouth 2 (two) times daily.   mupirocin ointment 2 % Commonly known as:  BACTROBAN Apply 1 application topically 2 (two) times daily as needed (wound care).   nebivolol 5 MG tablet Commonly known as:  BYSTOLIC Take 5 mg by mouth daily.   PREVIDENT 5000 BOOSTER PLUS 1.1 % Pste Generic drug:  Sodium Fluoride Place 1 application onto teeth 2 (two) times daily.   promethazine 25 MG tablet Commonly known as:  PHENERGAN Take 25 mg by mouth every 6 (six) hours as needed for nausea or vomiting.   ranitidine 150 MG tablet Commonly known as:  ZANTAC Take 150 mg by mouth  daily.   sodium bicarbonate 650 MG tablet Take 1,300 mg by mouth 2 (two) times daily.   tacrolimus 1 MG capsule Commonly known as:  PROGRAF Take 2 mg by mouth 2 (two) times daily.   tamsulosin 0.4 MG Caps capsule Commonly known as:  FLOMAX Take 0.4 mg by mouth daily.   traMADol 50 MG tablet Commonly known as:  ULTRAM Take 50 mg by mouth 2 (two) times daily as needed (pain).   VELTASSA 8.4 g packet Generic drug:  patiromer Take 8.4 g by mouth 2 (two) times daily. Dissolve packet in water and drink       Vitals:   10/17/16 0601 10/17/16 0943  BP: (!) 131/47 (!) 127/49  Pulse: (!) 55 (!) 57  Resp: 18 18  Temp: 98 F (36.7 C) 98.4 F (36.9 C)    Skin clean, dry and intact without evidence of skin break down, no evidence of skin tears noted. IV catheter discontinued intact. Site without signs and symptoms of complications. Dressing and pressure applied. Pt denies pain at this time. No complaints noted.  An After Visit Summary was printed and given to the patient. Patient escorted via [place], and D/C home via private auto.  Dixie Dials RN, BSN

## 2016-10-17 NOTE — Discharge Summary (Addendum)
Physician Discharge Summary  Allen Schultz EHM:[id number] DOB: 11-13-1950 DOA: 10/14/2016  PCP: Allen Schultz  Admit date: 10/14/2016 Discharge date: 10/17/2016  Time spent: 40 minutes  Recommendations for Outpatient Follow-up:  1. Please follow-up on the cytomegalovirus titers and see if any type of prophylaxis or treatment is needed although it is unlikely 2. Patient will need a chem 12 as well as an INR as well as a CBC in about 1-2 weeks 3. Would recommend repeat tacrolimus levels as an outpatient with his usual gastroenterologist 4. Consider as an outpatient ultrasound of abdomen pelvis to rule out mass in the bladder in the outpatient setting --the patient does have relatively new onset thrombocytopenia which could he secondary to either tacrolimus or early cirrhosis of the transplanted liver.  Discharge Diagnoses:  Principal Problem:   Fever and chills Active Problems:   Liver transplant recipient [name]   Immunosuppressed status ([name])   CKD (chronic kidney disease), stage III   Bladder wall thickening   Thrombocytopenia ([name])   Depression with anxiety   Diabetes mellitus, type II, insulin dependent ([name])   Immunocompromised ([name])   Discharge Condition: Improved  Diet recommendation: Heart healthy diabetic  Filed Weights   10/15/16 2039 10/15/16 2218 10/16/16 2157  Weight: 96.2 kg (212 lb) 122 kg (269 lb) 120.8 kg (266 lb 6.4 oz)    History of present illness:  66 y/o ? status post liver transplant 1996  On tacrolimus 2 mg twice a day History end-end ileocolonic anastomosis sigmoid colon, last scope 03/12/2014 Chronic hyperkalemia History of cranial neuropathy, low back pain, diabetic neuropathy Recurrent major depression in partial remission Gout Chronic kidney disease stage III Insulin-dependent diabetes mellitus HTN  Admitted early a.m. 6 23 2  [name] shaking chills right hours mild nausea Also has chronic diarrhea secondary  to cholecystectomy a long time ago  Thrombocytopenia 111,000, lactic acid 3.3-->2.2 CT abdomen? Cystitis versus mass Admitted for possible urinary tract infection in the setting of immunocompromise and on Prograf Urine culture neg, blood culture ngtd Cdiff neg GI pathogen panel neg  Hospital Course:   Sepsis without source, possible viral illness/gastroenteritis             On admission lactic acid 3.3--->0.9 currently             C. difficile is negative await GI pathogen panel             Blood culture urine culture unremarkable              placed on admission vancomycin/Zosyn-->discontinued on day of discharge 6/25             CT scan abdomen pelvis shows? Mass in the abdomen however patient has had numerous abdominal surgeries and CT scan is not the most reliable modality for bladder.   Patient does need ultrasound abdomen pelvis non-emergently as Schultz Dr. Unknown [place] gastroenterologist as an outpatient   Acute kidney injury Chronic hyperkalemia Chronic metabolic acidosis             Continue Veltassa 8.4 mg daily, potassium is 5             Baseline creatinine varies from 1.3-1.6 in care everywhere, currently trended down from 1.4-1.3             Continue sodium bicarbonate 1.3 g twice a day  History of liver transplant 1996             Continue tacrolimus 2 mg twice a day,  levels are not supratherapeutic and are between 2 and 20 range        at 7 Thrombocytopenia + Mild transaminitis             His baseline liver function is relatively normal with only slight increase in AST to about 60, his baseline            platelet count is in the 170s and is now 104              cardiopathy and elevated transaminases resolved this admission with fluid hydration recommend outpatient follow-up as above              His HIV is negative  Insulin-dependent diabetes mellitus             Sugars ranging 120 221 79 on sliding scale             Had home on 7030 insulin 20-30 twice a day, would  hold for now as it has only just started eating             Continue gabapentin 300 3 times a day  Stable this hospitalization   Gout             Holding colchicine 0.6 mg for now, continue allopurinol 100 daily  Hypertension             Continue nebivolol 5 daily, controlled moderately   Iatrogenic diarrhea in setting of end to end anastomosis             Continue Lomotil, not having any diarrhea present and this improved     ConsuTelephone consulted gastroenterology and infectious disease this admission  Stabilized prior to discharge  Discharge Exam: Vitals:   10/17/16 0601 10/17/16 0943  BP: (!) 131/47 (!) 127/49  Pulse: (!) 55 (!) 57  Resp: 18 18  Temp: 98 F (36.7 C) 98.4 F (36.9 C)    GenerEOMI NCAT no icterus no pallor pleasant eating and drinking without nausea vomiting has not had diarrhea since admission although is on Imodium [id number] holosystolic murmur grade 4/6 No click, no JVD, no hepatojugular reflex RespiClinically clear no added sound no rales no rhonchi  Abdomen soft nontender nondistended no rebound or guarding Neurologically is intact  Discharge Instructions    Current Discharge Medication List    CONTINUE these medications which have NOT CHANGED   Details  allopurinol (ZYLOPRIM) 100 MG tablet Take 100 mg by mouth daily.    baclofen (LIORESAL) 10 MG tablet Take 10 mg by mouth 3 (three) times daily as needed for muscle spasms.    colchicine 0.6 MG tablet Take 0.6 mg by mouth daily as needed (gout).    diazepam (VALIUM) 10 MG tablet Take 10 mg by mouth daily as needed (muscle spasms).    escitalopram (LEXAPRO) 20 MG tablet Take 10 mg by mouth daily as needed (depression).    ezetimibe (ZETIA) 10 MG tablet Take 10 mg by mouth daily.    gabapentin (NEURONTIN) 100 MG capsule Take 300 mg by mouth 2 (two) times daily.    insulin lispro protamine-lispro (HUMALOG 75/25 MIX) (75-25) 100 UNIT/ML SUSP injection Inject 20-30 Units into the  skin 2 (two) times daily before a meal.    loperamide (IMODIUM) 2 MG capsule Take 6 mg by mouth 2 (two) times daily.    mupirocin ointment (BACTROBAN) 2 % Apply 1 application topically 2 (two) times daily as needed (wound care).    nebivolol (BYSTOLIC) 5  MG tablet Take 5 mg by mouth daily.    patiromer (VELTASSA) 8.4 g packet Take 8.4 g by mouth 2 (two) times daily. Dissolve packet in water and drink    promethazine (PHENERGAN) 25 MG tablet Take 25 mg by mouth every 6 (six) hours as needed for nausea or vomiting.    ranitidine (ZANTAC) 150 MG tablet Take 150 mg by mouth daily.    sodium bicarbonate 650 MG tablet Take 1,300 mg by mouth 2 (two) times daily.    Sodium Fluoride (PREVIDENT 5000 BOOSTER PLUS) 1.1 % PSTE Place 1 application onto teeth 2 (two) times daily.    tacrolimus (PROGRAF) 1 MG capsule Take 2 mg by mouth 2 (two) times daily.    tamsulosin (FLOMAX) 0.4 MG CAPS capsule Take 0.4 mg by mouth daily.    traMADol (ULTRAM) 50 MG tablet Take 50 mg by mouth 2 (two) times daily as needed (pain).       Allergies  Allergen Reactions  . Clinoril [Sulindac] Swelling    Lip swelling  . Statins Other (See Comments)    Due to transplant      The results of significant diagnostics from this hospitalization (including imaging, microbiology, ancillary and laboratory) are listed below for reference.    Significant Diagnostic Studies: Dg Chest 2 View  Result Date: 10/14/2016 CLINICAL DATA:  Acute onset of fever and diaphoresis. Initial encounter. EXAM: CHEST  2 VIEW COMPARISON:  None. FINDINGS: The lungs are well-aerated. Pulmonary vascularity is at the upper limits of normal. Small bilateral pleural effusions are suggested on the lateral view. There is no evidence of focal opacification or pneumothorax. A rounded density at the periphery of the right lung base appears to reflect a remote healed rib fracture. The heart is normal in size; the mediastinal contour is within normal  limits. No acute osseous abnormalities are seen. IMPRESSION: Suggestion of small bilateral pleural effusions. Lungs otherwise grossly clear. Electronically Signed   By: Garald Balding M.D.   On: 10/14/2016 20:33   Ct Abdomen Pelvis W Contrast  Result Date: 10/14/2016 CLINICAL DATA:  Acute onset of generalized abdominal pain. Initial encounter. EXAM: CT ABDOMEN AND PELVIS WITH CONTRAST TECHNIQUE: Multidetector CT imaging of the abdomen and pelvis was performed using the standard protocol following bolus administration of intravenous contrast. CONTRAST:  186mL ISOVUE-300 IOPAMIDOL (ISOVUE-300) INJECTION 61% COMPARISON:  None. FINDINGS: Lower chest: Diffuse coronary artery calcifications are seen. Minimal right basilar atelectasis or scarring is noted. A slightly prominent 9 mm epicardial fat pad node is noted. Hepatobiliary: Scattered pneumobilia is noted. The transplant liver is grossly unremarkable in appearance. The patient is status post cholecystectomy. Vague soft tissue inflammation is seen tracking about the liver and gallbladder fossa, possibly reflecting postoperative change. Pancreas: Mildly prominent peripancreatic nodes measure up to 1.3 cm in short axis. The pancreas is grossly unremarkable. Spleen: The spleen is enlarged, measuring 19.1 cm in length. Adrenals/Urinary Tract: The adrenal glands are grossly unremarkable. Nonspecific perinephric stranding is noted bilaterally. There is no evidence of hydronephrosis. No renal or ureteral stones are identified. There is slight asymmetric prominence of the right ureter. Stomach/Bowel: The patient is status post resection of much of the colon. The ileocolic anastomosis at the lower mid abdomen is grossly unremarkable. The small bowel is grossly unremarkable. The stomach is largely decompressed and grossly unremarkable. There is mild nonspecific inflammation at the proximal mesentery. Vascular/Lymphatic: Scattered calcification is seen along the abdominal  aorta and its branches. The abdominal aorta is otherwise grossly unremarkable. The  inferior vena cava is grossly unremarkable. No retroperitoneal lymphadenopathy is seen. No pelvic sidewall lymphadenopathy is identified. Scattered prominent nodes are noted about the IVC, measuring up to 1.0 cm in short axis. A mildly prominent 1.2 cm mesenteric node is noted. No pelvic sidewall lymphadenopathy is seen. Reproductive: Soft tissue inflammation is noted about the right side of the bladder, with associated right-sided wall thickening. This may reflect cystitis, though underlying mass cannot be excluded. The prostate remains normal in size. Other: No additional soft tissue abnormalities are seen. Musculoskeletal: No acute osseous abnormalities are identified. Multilevel vacuum phenomenon is noted along the thoracic and lumbar spine. There is mild chronic loss of height at vertebral body L1. The visualized musculature is unremarkable in appearance. IMPRESSION: 1. Soft tissue inflammation about the right side of the bladder, with associated right-sided wall thickening. This may reflect acute cystitis, though underlying mass cannot be excluded. Cystoscopy could be considered for further evaluation. 2. Transplant liver is grossly unremarkable. Underlying mild pneumobilia noted. Vague soft tissue inflammation about the liver and gallbladder fossa may reflect postoperative change. 3. Significant splenomegaly. 4. Mild nonspecific inflammation at the proximal mesentery. Prominent retroperitoneal nodes noted about the IVC and pancreas, and mildly prominent 1.2 cm mesenteric node seen. These are of uncertain significance. 5. Diffuse coronary artery calcifications seen. 6. Ileocolic anastomosis is grossly unremarkable in appearance. 7. Mild chronic loss of height at vertebral body L1. Electronically Signed   By: Garald Balding M.D.   On: 10/14/2016 23:48    Microbiology: Recent Results (from the past 240 hour(s))  Blood culture  (routine x 2)     Status: None (Preliminary result)   Collection Time: 10/14/16  7:50 PM  Result Value Ref Range Status   Specimen Description BLOOD RIGHT ANTECUBITAL  Final   Special Requests   Final    BOTTLES DRAWN AEROBIC AND ANAEROBIC Blood Culture adequate volume   Culture NO GROWTH 3 DAYS  Final   Report Status PENDING  Incomplete  Urine culture     Status: None   Collection Time: 10/14/16  8:40 PM  Result Value Ref Range Status   Specimen Description URINE, CLEAN CATCH  Final   Special Requests Normal  Final   Culture NO GROWTH  Final   Report Status 10/16/2016 FINAL  Final  Blood culture (routine x 2)     Status: None (Preliminary result)   Collection Time: 10/15/16  2:27 AM  Result Value Ref Range Status   Specimen Description BLOOD RIGHT ANTECUBITAL  Final   Special Requests IN PEDIATRIC BOTTLE Blood Culture adequate volume  Final   Culture NO GROWTH 2 DAYS  Final   Report Status PENDING  Incomplete  Gastrointestinal Panel by PCR , Stool     Status: None   Collection Time: 10/15/16  6:45 PM  Result Value Ref Range Status   Campylobacter species NOT DETECTED NOT DETECTED Final   Plesimonas shigelloides NOT DETECTED NOT DETECTED Final   Salmonella species NOT DETECTED NOT DETECTED Final   Yersinia enterocolitica NOT DETECTED NOT DETECTED Final   Vibrio species NOT DETECTED NOT DETECTED Final   Vibrio cholerae NOT DETECTED NOT DETECTED Final   Enteroaggregative E coli (EAEC) NOT DETECTED NOT DETECTED Final   Enteropathogenic E coli (EPEC) NOT DETECTED NOT DETECTED Final   Enterotoxigenic E coli (ETEC) NOT DETECTED NOT DETECTED Final   Shiga like toxin producing E coli (STEC) NOT DETECTED NOT DETECTED Final   Shigella/Enteroinvasive E coli (EIEC) NOT DETECTED NOT DETECTED Final  Cryptosporidium NOT DETECTED NOT DETECTED Final   Cyclospora cayetanensis NOT DETECTED NOT DETECTED Final   Entamoeba histolytica NOT DETECTED NOT DETECTED Final   Giardia lamblia NOT DETECTED  NOT DETECTED Final   Adenovirus F40/41 NOT DETECTED NOT DETECTED Final   Astrovirus NOT DETECTED NOT DETECTED Final   Norovirus GI/GII NOT DETECTED NOT DETECTED Final   Rotavirus A NOT DETECTED NOT DETECTED Final   Sapovirus (I, II, IV, and V) NOT DETECTED NOT DETECTED Final  C difficile quick scan w PCR reflex     Status: Abnormal   Collection Time: 10/15/16  6:45 PM  Result Value Ref Range Status   C Diff antigen POSITIVE (A) NEGATIVE Final   C Diff toxin NEGATIVE NEGATIVE Final   C Diff interpretation Results are indeterminate. See PCR results.  Final  Clostridium Difficile by PCR     Status: None   Collection Time: 10/15/16  6:45 PM  Result Value Ref Range Status   Toxigenic C Difficile by pcr NEGATIVE NEGATIVE Final    Comment: Patient is colonized with non toxigenic C. difficile. May not need treatment unless significant symptoms are present.     Labs: Basic Metabolic Panel:  Recent Labs Lab 10/14/16 1920 10/15/16 0227 10/16/16 1752 10/17/16 0521  NA 137 135 134* 137  K 5.1 5.0 4.0 3.9  CL 106 106 105 106  CO2 23 21* 25 26  GLUCOSE 121* 124* 96 146*  BUN 16 14 12 12   CREATININE 1.40* 1.30* 1.38* 1.37*  CALCIUM 9.3 8.8* 9.0 8.6*   Liver Function Tests:  Recent Labs Lab 10/14/16 1920 10/15/16 0227 10/16/16 1752 10/17/16 0521  AST 158* 124* 51* 38  ALT 84* 74* 47 38  ALKPHOS 251* 215* 192* 167*  BILITOT 2.1* 4.3* 1.5* 1.3*  PROT 6.8 6.7 6.2* 5.5*  ALBUMIN 3.7 3.4* 3.0* 2.8*   No results for input(s): LIPASE, AMYLASE in the last 168 hours. No results for input(s): AMMONIA in the last 168 hours. CBC:  Recent Labs Lab 10/14/16 1920 10/15/16 0227 10/17/16 0521  WBC 9.4 8.6 5.1  NEUTROABS 7.3  --  2.2  HGB 12.3* 12.3* 10.2*  HCT 37.8* 37.7* 31.2*  MCV 90.6 90.2 89.4  PLT 111* 104* 87*   Cardiac Enzymes: No results for input(s): CKTOTAL, CKMB, CKMBINDEX, TROPONINI in the last 168 hours. BNP: BNP (last 3 results) No results for input(s): BNP in the  last 8760 hours.  ProBNP (last 3 results) No results for input(s): PROBNP in the last 8760 hours.  CBG:  Recent Labs Lab 10/16/16 1212 10/16/16 1714 10/16/16 2207 10/17/16 0739 10/17/16 1157  GLUCAP 179* 123* 129* 122* 136*       Signed:  Nita Sells MD   [facility] 10/17/2016, 3:05 PM

## 2016-10-19 DIAGNOSIS — M50223 Other cervical disc displacement at C6-C7 level: Secondary | ICD-10-CM | POA: Insufficient documentation

## 2016-10-19 LAB — CULTURE, BLOOD (ROUTINE X 2)
CULTURE: NO GROWTH
SPECIAL REQUESTS: ADEQUATE

## 2016-10-19 LAB — PATHOLOGIST SMEAR REVIEW

## 2016-10-19 LAB — CMV DNA BY PCR, QUALITATIVE: CMV DNA, Qual PCR: NEGATIVE

## 2016-10-20 LAB — CULTURE, BLOOD (ROUTINE X 2)
Culture: NO GROWTH
Special Requests: ADEQUATE

## 2016-10-24 DIAGNOSIS — G4710 Hypersomnia, unspecified: Secondary | ICD-10-CM | POA: Insufficient documentation

## 2016-10-24 DIAGNOSIS — E1129 Type 2 diabetes mellitus with other diabetic kidney complication: Secondary | ICD-10-CM | POA: Insufficient documentation

## 2016-10-24 DIAGNOSIS — R011 Cardiac murmur, unspecified: Secondary | ICD-10-CM | POA: Insufficient documentation

## 2016-10-24 DIAGNOSIS — A048 Other specified bacterial intestinal infections: Secondary | ICD-10-CM | POA: Insufficient documentation

## 2016-10-24 DIAGNOSIS — E1159 Type 2 diabetes mellitus with other circulatory complications: Secondary | ICD-10-CM | POA: Insufficient documentation

## 2016-10-25 DIAGNOSIS — F3341 Major depressive disorder, recurrent, in partial remission: Secondary | ICD-10-CM | POA: Insufficient documentation

## 2016-10-27 ENCOUNTER — Ambulatory Visit
Admission: RE | Admit: 2016-10-27 | Discharge: 2016-10-27 | Disposition: A | Discharge status: Home / Self Care | Payer: MEDICARE

## 2016-10-27 DIAGNOSIS — N189 Chronic kidney disease, unspecified: Principal | ICD-10-CM

## 2016-10-27 DIAGNOSIS — E875 Hyperkalemia: Secondary | ICD-10-CM

## 2016-11-01 MED ORDER — SODIUM POLYSTYRENE SULFONATE 15 GRAM/60 ML ORAL SUSPENSION
Freq: Every day | ORAL | 0 refills | 0 days | Status: CP
Start: 2016-11-01 — End: 2016-11-03

## 2016-11-08 ENCOUNTER — Ambulatory Visit
Admission: RE | Admit: 2016-11-08 | Discharge: 2016-11-08 | Payer: MEDICARE | Attending: Cardiovascular Disease | Admitting: Cardiovascular Disease

## 2016-11-08 DIAGNOSIS — I10 Essential (primary) hypertension: Secondary | ICD-10-CM

## 2016-11-08 DIAGNOSIS — Z944 Liver transplant status: Secondary | ICD-10-CM

## 2016-11-08 DIAGNOSIS — E119 Type 2 diabetes mellitus without complications: Secondary | ICD-10-CM

## 2016-11-08 DIAGNOSIS — E785 Hyperlipidemia, unspecified: Secondary | ICD-10-CM

## 2016-11-08 DIAGNOSIS — N183 Chronic kidney disease, stage 3 (moderate): Secondary | ICD-10-CM

## 2016-11-08 DIAGNOSIS — I358 Other nonrheumatic aortic valve disorders: Secondary | ICD-10-CM

## 2016-11-08 DIAGNOSIS — Z794 Long term (current) use of insulin: Secondary | ICD-10-CM

## 2016-11-08 DIAGNOSIS — R011 Cardiac murmur, unspecified: Principal | ICD-10-CM

## 2016-11-08 DIAGNOSIS — E1169 Type 2 diabetes mellitus with other specified complication: Secondary | ICD-10-CM

## 2016-11-28 DIAGNOSIS — H35363 Drusen (degenerative) of macula, bilateral: Secondary | ICD-10-CM | POA: Insufficient documentation

## 2016-11-28 MED ORDER — SODIUM POLYSTYRENE SULFONATE 15 GRAM/60 ML ORAL SUSPENSION
Freq: Every day | ORAL | 0 refills | 0 days | Status: CP
Start: 2016-11-28 — End: 2016-12-02

## 2016-11-29 ENCOUNTER — Ambulatory Visit: Admission: RE | Admit: 2016-11-29 | Discharge: 2016-11-29 | Payer: MEDICARE

## 2016-11-29 DIAGNOSIS — I358 Other nonrheumatic aortic valve disorders: Secondary | ICD-10-CM

## 2016-11-29 DIAGNOSIS — R011 Cardiac murmur, unspecified: Principal | ICD-10-CM

## 2016-12-02 MED ORDER — TACROLIMUS 1 MG CAPSULE
ORAL_CAPSULE | Freq: Two times a day (BID) | ORAL | 3 refills | 0 days | Status: CP
Start: 2016-12-02 — End: 2017-01-13

## 2016-12-02 MED ORDER — SODIUM POLYSTYRENE SULFONATE 15 GRAM/60 ML ORAL SUSPENSION
Freq: Every day | ORAL | 0 refills | 0.00000 days | Status: CP
Start: 2016-12-02 — End: 2016-12-03

## 2016-12-05 ENCOUNTER — Ambulatory Visit: Admission: RE | Admit: 2016-12-05 | Discharge: 2016-12-05

## 2016-12-05 DIAGNOSIS — N3289 Other specified disorders of bladder: Principal | ICD-10-CM

## 2016-12-05 DIAGNOSIS — N419 Inflammatory disease of prostate, unspecified: Secondary | ICD-10-CM

## 2016-12-05 MED ORDER — CIPROFLOXACIN 500 MG TABLET
ORAL_TABLET | Freq: Two times a day (BID) | ORAL | 0 refills | 0 days | Status: CP
Start: 2016-12-05 — End: 2016-12-19

## 2016-12-07 DIAGNOSIS — N411 Chronic prostatitis: Secondary | ICD-10-CM | POA: Insufficient documentation

## 2016-12-21 ENCOUNTER — Ambulatory Visit: Admission: RE | Admit: 2016-12-21 | Discharge: 2016-12-21

## 2016-12-21 DIAGNOSIS — E785 Hyperlipidemia, unspecified: Secondary | ICD-10-CM

## 2016-12-21 DIAGNOSIS — E11649 Type 2 diabetes mellitus with hypoglycemia without coma: Secondary | ICD-10-CM

## 2016-12-21 DIAGNOSIS — E559 Vitamin D deficiency, unspecified: Secondary | ICD-10-CM

## 2016-12-21 DIAGNOSIS — E291 Testicular hypofunction: Secondary | ICD-10-CM

## 2016-12-21 DIAGNOSIS — E1169 Type 2 diabetes mellitus with other specified complication: Secondary | ICD-10-CM

## 2016-12-21 DIAGNOSIS — Z794 Long term (current) use of insulin: Secondary | ICD-10-CM

## 2016-12-21 DIAGNOSIS — N183 Chronic kidney disease, stage 3 (moderate): Secondary | ICD-10-CM

## 2016-12-21 DIAGNOSIS — E119 Type 2 diabetes mellitus without complications: Principal | ICD-10-CM

## 2016-12-21 DIAGNOSIS — Z944 Liver transplant status: Secondary | ICD-10-CM

## 2016-12-21 DIAGNOSIS — I10 Essential (primary) hypertension: Secondary | ICD-10-CM

## 2017-01-13 MED ORDER — MYCOPHENOLATE SODIUM 180 MG TABLET,DELAYED RELEASE
ORAL_TABLET | Freq: Two times a day (BID) | ORAL | 11 refills | 0 days | Status: CP
Start: 2017-01-13 — End: 2018-01-25

## 2017-02-21 ENCOUNTER — Encounter (HOSPITAL_COMMUNITY): Payer: Self-pay | Admitting: Family Medicine

## 2017-02-21 ENCOUNTER — Ambulatory Visit (HOSPITAL_COMMUNITY)
Admission: EM | Admit: 2017-02-21 | Discharge: 2017-02-21 | Disposition: A | Discharge status: Home / Self Care | Payer: Medicare Other | Attending: Family Medicine | Admitting: Family Medicine

## 2017-02-21 DIAGNOSIS — S51812A Laceration without foreign body of left forearm, initial encounter: Secondary | ICD-10-CM

## 2017-02-21 NOTE — ED Provider Notes (Signed)
[facility]    CSN: [id number] Arrival date & time: 02/21/17  1120     History   Chief Complaint Chief Complaint  Patient presents with  . Open Wound    HPI Allen Schultz is a 66 y.o. male.   66 year-old male, with history of previous liver transplant currently on Prograf, DM, presenting today due to skin tear on the left posterior forearm. Patient states that he injured his forearm over the weekend. Has been using Bactroban at home and Bandaids. States that he has taken the bandaid off twice and pulled the top layer of skin off. Here today to have the wound dressed. Denies fever, chills, pain or redness to the affected area   The history is provided by the patient.  Wound Check  This is a new problem. The current episode started more than 2 days ago. The problem occurs constantly. The problem has not changed since onset.Pertinent negatives include no chest pain, no abdominal pain, no headaches and no shortness of breath. Nothing aggravates the symptoms. Nothing relieves the symptoms. He has tried nothing for the symptoms. The treatment provided no relief.    Past Medical History:  Diagnosis Date  . Colon cancer ([place])   . Diabetes mellitus without complication ([place])   . Heart murmur   . Hypertension   . Liver transplant recipient [facility])     Patient Active Problem List   Diagnosis Date Noted  . Liver transplant recipient [facility]) 10/15/2016  . Immunosuppressed status ([place]) 10/15/2016  . CKD (chronic kidney disease), stage III ([place]) 10/15/2016  . Fever and chills 10/15/2016  . Bladder wall thickening 10/15/2016  . Thrombocytopenia ([place]) 10/15/2016  . Depression with anxiety 10/15/2016  . Diabetes mellitus, type II, insulin dependent ([place]) 10/15/2016  . Immunocompromised [facility])     Past Surgical History:  Procedure Laterality Date  . CHOLECYSTECTOMY    . COLON SURGERY    . LIVER TRANSPLANT    . TONSILLECTOMY         Home Medications    Prior to  Admission medications   Medication Sig Start Date End Date Taking? Authorizing Provider  allopurinol (ZYLOPRIM) 100 MG tablet Take 100 mg by mouth daily.    [provider]  baclofen (LIORESAL) 10 MG tablet Take 10 mg by mouth 3 (three) times daily as needed for muscle spasms.    [provider]  colchicine 0.6 MG tablet Take 0.6 mg by mouth daily as needed (gout).    [provider]  diazepam (VALIUM) 10 MG tablet Take 10 mg by mouth daily as needed (muscle spasms).    [provider]  escitalopram (LEXAPRO) 20 MG tablet Take 10 mg by mouth daily as needed (depression).    [provider]  ezetimibe (ZETIA) 10 MG tablet Take 10 mg by mouth daily.    [provider]  gabapentin (NEURONTIN) 100 MG capsule Take 300 mg by mouth 2 (two) times daily.    [provider]  insulin lispro protamine-lispro (HUMALOG 75/25 MIX) (75-25) 100 UNIT/ML SUSP injection Inject 20-30 Units into the skin 2 (two) times daily before a meal.    [provider]  loperamide (IMODIUM) 2 MG capsule Take 6 mg by mouth 2 (two) times daily.    [provider]  mupirocin ointment (BACTROBAN) 2 % Apply 1 application topically 2 (two) times daily as needed (wound care).    [provider]  nebivolol (BYSTOLIC) 5 MG tablet Take 5 mg by mouth  daily.    [provider]  patiromer (VELTASSA) 8.4 g packet Take 8.4 g by mouth 2 (two) times daily. Dissolve packet in water and drink    [provider]  promethazine (PHENERGAN) 25 MG tablet Take 25 mg by mouth every 6 (six) hours as needed for nausea or vomiting.    [provider]  ranitidine (ZANTAC) 150 MG tablet Take 150 mg by mouth daily.    [provider]  sodium bicarbonate 650 MG tablet Take 1,300 mg by mouth 2 (two) times daily.    [provider]  Sodium Fluoride (PREVIDENT 5000 BOOSTER PLUS) 1.1 % PSTE Place 1 application onto teeth 2 (two) times  daily.    [provider]  tacrolimus (PROGRAF) 1 MG capsule Take 2 mg by mouth 2 (two) times daily.    [provider]  tamsulosin (FLOMAX) 0.4 MG CAPS capsule Take 0.4 mg by mouth daily.    [provider]  traMADol (ULTRAM) 50 MG tablet Take 50 mg by mouth 2 (two) times daily as needed (pain).    [provider]    Family History History reviewed. No pertinent family history.  Social History Social History  Substance Use Topics  . Smoking status: Never Smoker  . Smokeless tobacco: Never Used  . Alcohol use No     Allergies   Clinoril [sulindac] and Statins   Review of Systems Review of Systems  Constitutional: Negative for chills and fever.  HENT: Negative for ear pain and sore throat.   Eyes: Negative for pain and visual disturbance.  Respiratory: Negative for cough and shortness of breath.   Cardiovascular: Negative for chest pain and palpitations.  Gastrointestinal: Negative for abdominal pain and vomiting.  Genitourinary: Negative for dysuria and hematuria.  Musculoskeletal: Negative for arthralgias and back pain.  Skin: Positive for wound (skin tear to left posterior forearm ). Negative for color change and rash.  Neurological: Negative for seizures, syncope and headaches.  All other systems reviewed and are negative.    Physical Exam Triage Vital Signs ED Triage Vitals  Enc Vitals Group     BP 02/21/17 1221 (!) 144/74     Pulse Rate 02/21/17 1221 (!) 49     Resp 02/21/17 1221 18     Temp 02/21/17 1221 98.1 F (36.7 C)     Temp src --      SpO2 02/21/17 1221 97 %     Weight --      Height --      Head Circumference --      Peak Flow --      Pain Score 02/21/17 1220 6     Pain Loc --      Pain Edu? --      Excl. in [place]? --    No data found.   Updated Vital Signs BP (!) 144/74   Pulse (!) 49   Temp 98.1 F (36.7 C)   Resp 18   SpO2 97%   Visual Acuity Right Eye Distance:   Left Eye Distance:   Bilateral  Distance:    Right Eye Near:   Left Eye Near:    Bilateral Near:     Physical Exam  Constitutional: He appears well-developed and well-nourished.  HENT:  Head: Normocephalic and atraumatic.  Eyes: Conjunctivae are normal.  Neck: Neck supple.  Cardiovascular: Normal rate and regular rhythm.   No murmur heard. Pulmonary/Chest: Effort normal and breath sounds normal. No respiratory distress.  Abdominal: Soft.  There is no tenderness.  Musculoskeletal: He exhibits no edema.  Neurological: He is alert.  Skin: Skin is warm and dry. Abrasion noted.  Skin tear to the dorsal aspect of the left forearm. Wound appears to be healing well with no evidence of infection  Psychiatric: He has a normal mood and affect.  Nursing note and vitals reviewed.    UC Treatments / Results  Labs (all labs ordered are listed, but only abnormal results are displayed) Labs Reviewed - No data to display  EKG  EKG Interpretation None       Radiology No results found.  Procedures Procedures (including critical care time)  Medications Ordered in UC Medications - No data to display   Initial Impression / Assessment and Plan / UC Course  I have reviewed the triage vital signs and the nursing notes.  Pertinent labs & imaging results that were available during my care of the patient were reviewed by me and considered in my medical decision making (see chart for details).     Skin tear - requesting wound be dressed with nonstick bandage. Recommended he continue to use the bactroban. He was given telfa bandage   Final Clinical Impressions(s) / UC Diagnoses   Final diagnoses:  Skin tear of left forearm without complication, initial encounter    New Prescriptions New Prescriptions   No medications on file     Controlled Substance Prescriptions [place] Controlled Substance Registry consulted? Not Applicable   Phebe Colla, [place] 02/21/17 1252

## 2017-02-21 NOTE — ED Triage Notes (Signed)
Pt here for skin tear to left arm

## 2017-03-06 DIAGNOSIS — N451 Epididymitis: Secondary | ICD-10-CM | POA: Insufficient documentation

## 2017-03-07 DIAGNOSIS — M8589 Other specified disorders of bone density and structure, multiple sites: Secondary | ICD-10-CM | POA: Insufficient documentation

## 2017-05-18 ENCOUNTER — Encounter: Admit: 2017-05-18 | Discharge: 2017-05-18 | Payer: MEDICARE

## 2017-05-18 ENCOUNTER — Ambulatory Visit: Admit: 2017-05-18 | Discharge: 2017-05-18 | Payer: MEDICARE

## 2017-05-18 DIAGNOSIS — Z944 Liver transplant status: Principal | ICD-10-CM

## 2017-05-18 DIAGNOSIS — D899 Disorder involving the immune mechanism, unspecified: Secondary | ICD-10-CM

## 2017-05-18 DIAGNOSIS — Z23 Encounter for immunization: Secondary | ICD-10-CM

## 2017-05-18 DIAGNOSIS — K769 Liver disease, unspecified: Secondary | ICD-10-CM

## 2017-05-31 ENCOUNTER — Encounter: Admit: 2017-05-31 | Discharge: 2017-05-31 | Payer: MEDICARE

## 2017-05-31 ENCOUNTER — Encounter: Admit: 2017-05-31 | Discharge: 2017-05-31 | Payer: MEDICARE | Attending: Anesthesiology | Primary: Anesthesiology

## 2017-05-31 DIAGNOSIS — Z1211 Encounter for screening for malignant neoplasm of colon: Principal | ICD-10-CM

## 2017-06-14 DIAGNOSIS — M5126 Other intervertebral disc displacement, lumbar region: Secondary | ICD-10-CM | POA: Insufficient documentation

## 2017-07-05 DIAGNOSIS — I350 Nonrheumatic aortic (valve) stenosis: Secondary | ICD-10-CM | POA: Insufficient documentation

## 2017-07-05 DIAGNOSIS — I272 Pulmonary hypertension, unspecified: Secondary | ICD-10-CM | POA: Insufficient documentation

## 2017-07-05 DIAGNOSIS — I517 Cardiomegaly: Secondary | ICD-10-CM | POA: Insufficient documentation

## 2017-09-19 ENCOUNTER — Encounter (HOSPITAL_COMMUNITY): Payer: Self-pay | Admitting: Emergency Medicine

## 2017-09-19 ENCOUNTER — Ambulatory Visit (HOSPITAL_COMMUNITY)
Admission: EM | Admit: 2017-09-19 | Discharge: 2017-09-19 | Disposition: A | Discharge status: Home / Self Care | Payer: Medicare Other | Attending: Emergency Medicine | Admitting: Emergency Medicine

## 2017-09-19 ENCOUNTER — Other Ambulatory Visit: Payer: Self-pay

## 2017-09-19 DIAGNOSIS — H6690 Otitis media, unspecified, unspecified ear: Secondary | ICD-10-CM

## 2017-09-19 MED ORDER — AMOXICILLIN 500 MG PO CAPS: 500.0000 mg | ORAL_CAPSULE | Freq: Three times a day (TID) | ORAL | 0 refills | Status: DC

## 2017-09-19 NOTE — ED Provider Notes (Signed)
[place]    CSN: [id number] Arrival date & time: 09/19/17  1002     History   Chief Complaint Chief Complaint  Patient presents with  . Otalgia    HPI Allen Schultz is a 67 y.o. male.   Rt ear pain for 3 days now . States that he takes tramadol for pain already. Has tried ear drops with no relief. Denies any fever, no congestion. Does not have chronic ear infections.      Past Medical History:  Diagnosis Date  . Colon cancer ([place])   . Diabetes mellitus without complication ([place])   . Heart murmur   . Hypertension   . Liver transplant recipient [facility])     Patient Active Problem List   Diagnosis Date Noted  . Liver transplant recipient [facility]) 10/15/2016  . Immunosuppressed status ([place]) 10/15/2016  . CKD (chronic kidney disease), stage III ([place]) 10/15/2016  . Fever and chills 10/15/2016  . Bladder wall thickening 10/15/2016  . Thrombocytopenia ([place]) 10/15/2016  . Depression with anxiety 10/15/2016  . Diabetes mellitus, type II, insulin dependent ([place]) 10/15/2016  . Immunocompromised [facility])     Past Surgical History:  Procedure Laterality Date  . CHOLECYSTECTOMY    . COLON SURGERY    . LIVER TRANSPLANT    . TONSILLECTOMY         Home Medications    Prior to Admission medications   Medication Sig Start Date End Date Taking? Authorizing Provider  allopurinol (ZYLOPRIM) 100 MG tablet Take 100 mg by mouth daily.    [provider]  baclofen (LIORESAL) 10 MG tablet Take 10 mg by mouth 3 (three) times daily as needed for muscle spasms.    [provider]  colchicine 0.6 MG tablet Take 0.6 mg by mouth daily as needed (gout).    [provider]  diazepam (VALIUM) 10 MG tablet Take 10 mg by mouth daily as needed (muscle spasms).    [provider]  escitalopram (LEXAPRO) 20 MG tablet Take 10 mg by mouth daily as needed (depression).    [provider]  ezetimibe (ZETIA) 10 MG tablet Take 10 mg by mouth  daily.    [provider]  gabapentin (NEURONTIN) 100 MG capsule Take 300 mg by mouth 2 (two) times daily.    [provider]  insulin lispro protamine-lispro (HUMALOG 75/25 MIX) (75-25) 100 UNIT/ML SUSP injection Inject 20-30 Units into the skin 2 (two) times daily before a meal.    [provider]  loperamide (IMODIUM) 2 MG capsule Take 6 mg by mouth 2 (two) times daily.    [provider]  mupirocin ointment (BACTROBAN) 2 % Apply 1 application topically 2 (two) times daily as needed (wound care).    [provider]  nebivolol (BYSTOLIC) 5 MG tablet Take 5 mg by mouth daily.    [provider]  patiromer (VELTASSA) 8.4 g packet Take 8.4 g by mouth 2 (two) times daily. Dissolve packet in water and drink    [provider]  promethazine (PHENERGAN) 25 MG tablet Take 25 mg by mouth every 6 (six) hours as needed for nausea or vomiting.    [provider]  ranitidine (ZANTAC) 150 MG tablet Take 150 mg by mouth daily.    [provider]  sodium bicarbonate 650 MG tablet Take 1,300 mg by mouth 2 (two) times daily.    [provider]  Sodium Fluoride (PREVIDENT 5000 BOOSTER PLUS) 1.1 % PSTE Place 1 application onto  teeth 2 (two) times daily.    [provider]  tacrolimus (PROGRAF) 1 MG capsule Take 2 mg by mouth 2 (two) times daily.    [provider]  tamsulosin (FLOMAX) 0.4 MG CAPS capsule Take 0.4 mg by mouth daily.    [provider]  traMADol (ULTRAM) 50 MG tablet Take 50 mg by mouth 2 (two) times daily as needed (pain).    [provider]    Family History History reviewed. No pertinent family history.  Social History Social History   Tobacco Use  . Smoking status: Never Smoker  . Smokeless tobacco: Never Used  Substance Use Topics  . Alcohol use: No  . Drug use: No     Allergies   Clinoril [sulindac] and Statins   Review of Systems Review of Systems    HENT: Positive for ear pain.   Eyes: Negative.   Respiratory: Negative.   Cardiovascular: Negative.   Genitourinary: Negative.   Neurological: Negative.      Physical Exam Triage Vital Signs ED Triage Vitals  Enc Vitals Group     BP 09/19/17 1015 131/64     Pulse Rate 09/19/17 1015 64     Resp 09/19/17 1015 18     Temp 09/19/17 1015 98.1 F (36.7 C)     Temp Source 09/19/17 1015 Oral     SpO2 09/19/17 1015 97 %     Weight --      Height --      Head Circumference --      Peak Flow --      Pain Score 09/19/17 1016 6     Pain Loc --      Pain Edu? --      Excl. in [place]? --    No data found.  Updated Vital Signs BP 131/64 (BP Location: Right Arm)   Pulse 64   Temp 98.1 F (36.7 C) (Oral)   Resp 18   SpO2 97%   Visual Acuity     Physical Exam  HENT:  Right Ear: External ear normal.  Left Ear: External ear normal.  Mouth/Throat: Oropharynx is clear and moist.  LT TM bulging, yellow drainage,  Erythema, noted   Eyes: Pupils are equal, round, and reactive to light.  Neck: Normal range of motion.  Cardiovascular: Normal rate and regular rhythm.  Pulmonary/Chest: Effort normal.  Neurological: He is alert.     UC Treatments / Results  Labs (all labs ordered are listed, but only abnormal results are displayed) Labs Reviewed - No data to display  EKG None  Radiology No results found.  Procedures Procedures (including critical care time)  Medications Ordered in UC Medications - No data to display  Initial Impression / Assessment and Plan / UC Course  I have reviewed the triage vital signs and the nursing notes.  Pertinent labs & imaging results that were available during my care of the patient were reviewed by me and considered in my medical decision making (see chart for details).     Take full dose of abx  Do not place anything in your ear  Take with food . Take NSAIDS as needed for pain only 400mg  every 6 hours due to liver concerns.  Final  Clinical Impressions(s) / UC Diagnoses   Final diagnoses:  None   Discharge Instructions   None    ED Prescriptions    None     Controlled Substance Prescriptions  Controlled Substance Registry consulted? Not Applicable  Marney Setting, NP 09/19/17 1031

## 2017-09-19 NOTE — ED Triage Notes (Signed)
The patient presented to the [facility] with a complaint of left ear pain x 3 days. 

## 2018-01-10 ENCOUNTER — Encounter: Admit: 2018-01-10 | Discharge: 2018-01-11 | Payer: MEDICARE

## 2018-01-10 DIAGNOSIS — R002 Palpitations: Secondary | ICD-10-CM

## 2018-01-10 DIAGNOSIS — I350 Nonrheumatic aortic (valve) stenosis: Principal | ICD-10-CM

## 2018-01-10 DIAGNOSIS — Z794 Long term (current) use of insulin: Secondary | ICD-10-CM

## 2018-01-10 DIAGNOSIS — Z944 Liver transplant status: Secondary | ICD-10-CM

## 2018-01-10 DIAGNOSIS — E119 Type 2 diabetes mellitus without complications: Secondary | ICD-10-CM

## 2018-01-25 MED ORDER — MYCOPHENOLATE SODIUM 180 MG TABLET,DELAYED RELEASE
ORAL_TABLET | Freq: Two times a day (BID) | ORAL | 3 refills | 0.00000 days | Status: CP
Start: 2018-01-25 — End: 2019-01-25

## 2018-04-10 ENCOUNTER — Encounter: Admit: 2018-04-10 | Discharge: 2018-04-11 | Payer: MEDICARE

## 2018-04-10 DIAGNOSIS — D899 Disorder involving the immune mechanism, unspecified: Secondary | ICD-10-CM

## 2018-04-10 DIAGNOSIS — Z944 Liver transplant status: Principal | ICD-10-CM

## 2018-06-25 DIAGNOSIS — Z944 Liver transplant status: Principal | ICD-10-CM

## 2018-07-16 DIAGNOSIS — Z944 Liver transplant status: Principal | ICD-10-CM

## 2018-08-29 ENCOUNTER — Telehealth: Payer: Self-pay | Admitting: *Deleted

## 2018-08-29 NOTE — Telephone Encounter (Signed)
Tried calling pt on [phone]. Number disconnected.  Called, LVM on [phone] for pt to call about appt on 09/04/18 with Dr. Felecia Shelling

## 2018-09-04 ENCOUNTER — Ambulatory Visit: Payer: Medicare Other | Admitting: Neurology

## 2018-10-04 ENCOUNTER — Encounter: Payer: Self-pay | Admitting: *Deleted

## 2018-10-04 NOTE — Progress Notes (Signed)
Updated pt meds, allergies, pharmacy. Reviewed paper referral.

## 2018-10-08 ENCOUNTER — Other Ambulatory Visit: Payer: Self-pay

## 2018-10-08 ENCOUNTER — Ambulatory Visit (INDEPENDENT_AMBULATORY_CARE_PROVIDER_SITE_OTHER): Payer: Medicare Other | Admitting: Neurology

## 2018-10-08 ENCOUNTER — Telehealth: Payer: Self-pay | Admitting: Neurology

## 2018-10-08 ENCOUNTER — Encounter: Payer: Self-pay | Admitting: Neurology

## 2018-10-08 VITALS — BP 135/58 | HR 61 | Temp 97.8°F | Ht 70.5 in | Wt 254.5 lb

## 2018-10-08 DIAGNOSIS — M542 Cervicalgia: Secondary | ICD-10-CM

## 2018-10-08 DIAGNOSIS — M5412 Radiculopathy, cervical region: Secondary | ICD-10-CM | POA: Diagnosis not present

## 2018-10-08 DIAGNOSIS — M4802 Spinal stenosis, cervical region: Secondary | ICD-10-CM | POA: Diagnosis not present

## 2018-10-08 MED ORDER — GABAPENTIN 300 MG PO CAPS: 300.0000 mg | ORAL_CAPSULE | Freq: Three times a day (TID) | ORAL | 5 refills | Status: DC

## 2018-10-08 NOTE — Telephone Encounter (Signed)
[employer] medicare order sent to GI. No auth they will reach out to the patient to schedule.  

## 2018-10-08 NOTE — Progress Notes (Signed)
[facility]  PATIENT: Allen Schultz DOB: July 28, 1950  REFERRING DOCTOR OR PCP:  Dr. Malen Gauze SOURCE: Patient, notes from primary care, notes from [facility], imaging and laboratory reports, MRI images personally reviewed.  _________________________________   HISTORICAL  CHIEF COMPLAINT:  Chief Complaint  Patient presents with  . New Patient (Initial Visit)    RM 12,alone. Paper referral from Dr. Malen Gauze for cervical radiculopathy. Sx started about 3-4 years ago. Neurologist in [place], [place] did not address per pt. Muscles in left shoulder very tight.Hard for him to turn neck left and right. He has hx of lower back issues. No hx of back surgeries. Has had back inj.     HISTORY OF PRESENT ILLNESS:  I had the pleasure of seeing patient, Allen "Lissa Schultz" Alcario Drought, at [facility] for neurologic consultation regarding his neck pain and left shoulder pain.  He is a 68 yo man who has had neck pain x 3 years. He starts to have more pain and tightness if he looks to either side.   Pain radiates from his neck to his shoulder but not down the arm.   He denies any numbness in the left arm but feels there is some weakness, especially raising the arm over his head.    The weakness has seemed worse  the last year.  He notes some muscle spasticity in the neck.  He denies urinary changes.    Tramadol and cyclobenzaprine have helped some.     He had been seeing Dr. Doy Hutching in [facility] for many years since having Bells palsy and more recently mostly for his lower back and migraines.    His lower back pain and lumbar radicular pain has actually done better the last year.  He notes having an epidural steroid injection in the past with benefit.  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    He has mild renal failure with a BUN/crt of 13/1.15)  I reviewed the imaging reports and also personally reviewed the  MRI images of the cervical spine.  He has mild spinal stenosis at multiple levels worse at C5-C6.  Shortly he has moderate foraminal narrowing to the left at C2C3, moderate right > left foraminal narrowing at C5C6, severe right > left foraminal narrowing at C6C7.  I showed Mr. Stauffer the MRI images.   Official IMPRESSION (MRI Cervical Spine) 10/19/2016):Abnormal MRI of the Cervical spine without contrast. 1. C6-C7 degenerative disc changes resulting in right lateral recess spinal canal stenosis. Probable impingement upon the exiting right C7 nerve root. 2. C5-C6 degenerative disc and endplate changes effacing the ventral cord surface without myelopathic signal changes. Moderate right neural foraminal stenosis. 3. C3-4, C4-5 C7-T1 minor degenerative disc changes. 4. C2-3 mild left facet hypertrophy.  IMPRESSION (MRI Lumbar 06/14/17):  This is an abnormal study showing multiple abnormalities: 1.Is a disc herniation at the L2-3 level with tricompartment disease producing moderately severe central canal stenosis. There is potential for compression of any of the traversing nerve roots. 2.There is a remote compression fracture of the L1 vertebral body. 3.There are type I degenerative endplate changes of the inferior endplate of L2. 4.There is a node of the inferior endplate of L4. 5.There is a disc bulge at the L5-S1 level with an annular tear. There is no evidence of nerve root involvement. 6.There are disc bulges at all other levels of the lumbar spine as well as at T11-12. There is no clear evidence  of nerve root compression at any of these levels but there is significant degenerative facet joint change seen bilaterally at all levels.  REVIEW OF SYSTEMS: Constitutional: No fevers, chills, sweats, or change in appetite Eyes: No visual changes, double vision, eye pain Ear, nose and throat: No hearing loss, ear pain, nasal congestion, sore throat Cardiovascular: No chest pain, palpitations  Respiratory: No shortness of breath at rest or with exertion.   No wheezes GastrointestinaI: No nausea, vomiting, diarrhea, abdominal pain, fecal incontinence.   Liver transplant in 1996. Genitourinary: No dysuria, urinary retention or frequency.  No nocturia. Musculoskeletal: as above. Integumentary: No rash, pruritus, skin lesions Neurological: as above Psychiatric: No depression at this time.  No anxiety Endocrine: No palpitations, diaphoresis, change in appetite, change in weigh or increased thirst Hematologic/Lymphatic: No anemia, purpura, petechiae. Allergic/Immunologic: No itchy/runny eyes, nasal congestion, recent allergic reactions, rashes  ALLERGIES: Allergies  Allergen Reactions  . Clinoril [Sulindac] Swelling    Lip swelling  . Statins Other (See Comments)    Due to transplant    HOME MEDICATIONS:  Current Outpatient Medications:  .  clotrimazole (LOTRIMIN) 1 % cream, Apply 1 application topically 2 (two) times daily., Disp: , Rfl:  .  cyclobenzaprine (FLEXERIL) 10 MG tablet, Take 10 mg by mouth as needed for muscle spasms., Disp: , Rfl:  .  ezetimibe (ZETIA) 10 MG tablet, Take 10 mg by mouth daily., Disp: , Rfl:  .  famotidine (PEPCID) 20 MG tablet, Take 20 mg by mouth daily., Disp: , Rfl:  .  gabapentin (NEURONTIN) 100 MG capsule, Take 300 mg by mouth 2 (two) times daily., Disp: , Rfl:  .  insulin lispro protamine-lispro (HUMALOG 75/25 MIX) (75-25) 100 UNIT/ML SUSP injection, Inject 20-30 Units into the skin 2 (two) times daily before a meal., Disp: , Rfl:  .  loperamide (IMODIUM) 2 MG capsule, Take 6 mg by mouth 2 (two) times daily., Disp: , Rfl:  .  mupirocin ointment (BACTROBAN) 2 %, Apply 1 application topically 2 (two) times daily as needed (wound care)., Disp: , Rfl:  .  mycophenolate (MYFORTIC) 180 MG EC tablet, Take 180 mg by mouth 2 (two) times daily., Disp: , Rfl:  .  nebivolol (BYSTOLIC) 5 MG tablet, Take 5 mg by mouth daily., Disp: , Rfl:  .   Ondansetron HCl (ZOFRAN PO), Take by mouth as needed., Disp: , Rfl:  .  Patiromer Sorbitex Calcium (VELTASSA) 16.8 g PACK, Take by mouth., Disp: , Rfl:  .  sildenafil (VIAGRA) 100 MG tablet, Take 100 mg by mouth daily as needed for erectile dysfunction., Disp: , Rfl:  .  sodium bicarbonate 650 MG tablet, Take 1,300 mg by mouth 2 (two) times daily., Disp: , Rfl:  .  Sodium Fluoride (PREVIDENT 5000 BOOSTER PLUS) 1.1 % PSTE, Place 1 application onto teeth 2 (two) times daily., Disp: , Rfl:  .  tamsulosin (FLOMAX) 0.4 MG CAPS capsule, Take 0.4 mg by mouth daily., Disp: , Rfl:  .  traMADol (ULTRAM) 50 MG tablet, Take 50 mg by mouth 2 (two) times daily as needed (pain)., Disp: , Rfl:  .  Vitamin D, Ergocalciferol, (DRISDOL) 1.25 MG (50000 UT) CAPS capsule, Take 50,000 Units by mouth every 7 (seven) days., Disp: , Rfl:   PAST MEDICAL HISTORY: Past Medical History:  Diagnosis Date  . Acid reflux   . Anxiety   . Colon cancer ([place])   . Colon polyp   . Depression   . Diabetes mellitus without complication ([place])   .  Heart murmur   . High cholesterol   . Hypertension   . Liver transplant recipient [facility])   . Neuropathy     PAST SURGICAL HISTORY: Past Surgical History:  Procedure Laterality Date  . CHOLECYSTECTOMY    . COLON SURGERY    . [place]  . LIVER TRANSPLANT  1996  . TONSILLECTOMY      FAMILY HISTORY: Family History  Problem Relation Age of Onset  . Cancer Mother   . Heart disease Father   . Hypertension Father     SOCIAL HISTORY:  Social History   Socioeconomic History  . Marital status: Divorced    Spouse name: Not on file  . Number of children: 0  . Years of education: Associates  . Highest education level: Not on file  Occupational History  . Occupation: Retired  Scientific laboratory technician  . Financial resource strain: Not on file  . Food insecurity    Worry: Not on file    Inability: Not on file  . Transportation needs    Medical: Not on file    Non-medical:  Not on file  Tobacco Use  . Smoking status: Former Smoker    Types: Cigarettes  . Smokeless tobacco: Never Used  Substance and Sexual Activity  . Alcohol use: No  . Drug use: No  . Sexual activity: Never  Lifestyle  . Physical activity    Days per week: Not on file    Minutes per session: Not on file  . Stress: Not on file  Relationships  . Social Herbalist on phone: Not on file    Gets together: Not on file    Attends religious service: Not on file    Active member of club or organization: Not on file    Attends meetings of clubs or organizations: Not on file    Relationship status: Not on file  . Intimate partner violence    Fear of current or ex partner: Not on file    Emotionally abused: Not on file    Physically abused: Not on file    Forced sexual activity: Not on file  Other Topics Concern  . Not on file  Social History Narrative   HCPOA: Krishawn Vanderweele (sibling)   Right handed    Lives alone     PHYSICAL EXAM  Vitals:   10/08/18 1023  BP: (!) 135/58  Pulse: 61  Temp: 97.8 F (36.6 C)  Weight: 254 lb 8 oz (115.4 kg)  Height: 5' 10.5" (1.791 m)    Body mass index is 36 kg/m.   General: The patient is well-developed and well-nourished and in no acute distress  HEENT:  Head is [place]/AT.  Sclera are anicteric.   Neck: He has a murmur radiating up from the heart.  The neck is nontender but has a mildly reduced range of motion.  Cardiovascular: The heart has a regular rate and rhythm with a normal S1 and S2.  He has a 4/6 systolic murmur  Skin: Extremities are without rash or  edema.   Neurologic Exam  Mental status: The patient is alert and oriented x 3 at the time of the examination. The patient has apparent normal recent and remote memory, with an apparently normal attention span and concentration ability.   Speech is normal.  Cranial nerves: Extraocular movements are full. There is good facial sensation to soft touch bilaterally.Facial  strength is normal.  Trapezius and sternocleidomastoid strength is normal. No dysarthria is  noted.   No obvious hearing deficits are noted.  Motor:  Muscle bulk is normal.   Tone is normal. Strength is  5 / 5 in all 4 extremities except for plus/5 left triceps and finger extensors..   Sensory: Sensory testing is intact to pinprick, soft touch and vibration sensation in all 4 extremities including the toes.  Coordination: Cerebellar testing reveals good finger-nose-finger and heel-to-shin bilaterally.  Gait and station: Station is normal.   Gait is normal. Tandem gait is mildly wide.. Romberg is negative.   Reflexes: Deep tendon reflexes are symmetric and normal bilaterally.   Plantar responses are flexor.    DIAGNOSTIC DATA (LABS, IMAGING, TESTING) - I reviewed patient records, labs, notes, testing and imaging myself where available.  Lab Results  Component Value Date   WBC 5.1 10/17/2016   HGB 10.2 (L) 10/17/2016   HCT 31.2 (L) 10/17/2016   MCV 89.4 10/17/2016   PLT 87 (L) 10/17/2016      Component Value Date/Time   NA 137 10/17/2016 0521   K 3.9 10/17/2016 0521   CL 106 10/17/2016 0521   CO2 26 10/17/2016 0521   GLUCOSE 146 (H) 10/17/2016 0521   BUN 12 10/17/2016 0521   CREATININE 1.37 (H) 10/17/2016 0521   CALCIUM 8.6 (L) 10/17/2016 0521   PROT 5.5 (L) 10/17/2016 0521   ALBUMIN 2.8 (L) 10/17/2016 0521   AST 38 10/17/2016 0521   ALT 38 10/17/2016 0521   ALKPHOS 167 (H) 10/17/2016 0521   BILITOT 1.3 (H) 10/17/2016 0521   GFRNONAA 53 (L) 10/17/2016 0521   GFRAA >60 10/17/2016 0521       ASSESSMENT AND PLAN  1. Neck pain   2. Cervical stenosis of spine   3. Cervical radiculopathy     In summary, Mr. Silsby is a 68 year old man who has had a several year history of neck pain with pain that radiates down towards the left shoulder.  He does have multilevel degenerative changes on MRI.  His slight weakness in the left arm would be most consistent with a C7  radiculopathy though his pain would be more consistent with a C4 or C5 radiculopathy.  Either way, symptoms have progressed and we need to check and other imaging study to help determine if he needs an epidural steroid injection or referral to surgery.  In the meantime I will increase his gabapentin from 300 mg twice a day to 300 mg 3 times a day.  He does have mild renal failure so we probably will not push this dose any further.  He will continue to take tramadol as needed.  We will contact him with the results of the imaging study.  If not better at that time we will set up an epidural steroid injection (or referral to surgery) based on the findings of the MRI.  Further follow-up will be scheduled as well.  He should call us if he has any new or worsening neurologic symptoms.  Thank you for asked me to see Mr. Katzenstein.  Please let me know if I can be of further assistance with him or other patients in the future.   Richard A. Felecia Shelling, MD, [facility] 1/61/0960, 45:40 AM Certified in Neurology, Clinical Neurophysiology, Sleep Medicine and Neuroimaging  [facility] [address] [place], [place] [address] [phone]

## 2018-10-31 ENCOUNTER — Ambulatory Visit: Admit: 2018-10-31 | Discharge: 2018-10-31 | Payer: MEDICARE

## 2018-10-31 DIAGNOSIS — Z794 Long term (current) use of insulin: Secondary | ICD-10-CM

## 2018-10-31 DIAGNOSIS — I10 Essential (primary) hypertension: Principal | ICD-10-CM

## 2018-10-31 DIAGNOSIS — E785 Hyperlipidemia, unspecified: Secondary | ICD-10-CM

## 2018-10-31 DIAGNOSIS — Z944 Liver transplant status: Secondary | ICD-10-CM

## 2018-10-31 DIAGNOSIS — E119 Type 2 diabetes mellitus without complications: Secondary | ICD-10-CM

## 2018-10-31 DIAGNOSIS — E1169 Type 2 diabetes mellitus with other specified complication: Secondary | ICD-10-CM

## 2018-10-31 DIAGNOSIS — I350 Nonrheumatic aortic (valve) stenosis: Principal | ICD-10-CM

## 2018-11-03 ENCOUNTER — Other Ambulatory Visit: Payer: Self-pay

## 2018-11-03 ENCOUNTER — Ambulatory Visit
Admission: RE | Admit: 2018-11-03 | Discharge: 2018-11-03 | Disposition: A | Discharge status: Home / Self Care | Payer: Medicare Other | Source: Ambulatory Visit | Attending: Neurology | Admitting: Neurology

## 2018-11-03 DIAGNOSIS — M4802 Spinal stenosis, cervical region: Secondary | ICD-10-CM

## 2018-11-03 DIAGNOSIS — M542 Cervicalgia: Secondary | ICD-10-CM

## 2018-11-03 DIAGNOSIS — M5412 Radiculopathy, cervical region: Secondary | ICD-10-CM

## 2018-11-04 ENCOUNTER — Other Ambulatory Visit: Payer: Self-pay | Admitting: Neurology

## 2018-11-04 DIAGNOSIS — M4312 Spondylolisthesis, cervical region: Secondary | ICD-10-CM

## 2018-11-04 DIAGNOSIS — M4802 Spinal stenosis, cervical region: Secondary | ICD-10-CM

## 2018-11-04 NOTE — Progress Notes (Signed)
c3c4 spondylolisthesis

## 2018-11-05 ENCOUNTER — Telehealth: Payer: Self-pay | Admitting: *Deleted

## 2018-11-05 NOTE — Telephone Encounter (Signed)
-----   Message from [name] Bottom, MD sent at 11/04/2018  8:40 AM EDT ----- Please let the patient know that the MRI shows more degenerative change at [facility] and he now has spinal stenosis there.   We need to check a cervical spine x-ray with flexion and extension (I out the orders in for [facility]) to see if stenosis worsens with movements

## 2018-11-05 NOTE — Telephone Encounter (Signed)
Called pt. Relayed results per Dr. Felecia Shelling note.  He started having stomach issues yesterday and today. Has had no fever or any other sx. He has started to feel better today but still weak.  He is going to wait until he feels better to get xray done. Did relay he can walk in to [place] imaging for xray. No appt needed. He verbalized understanding.

## 2018-11-12 ENCOUNTER — Ambulatory Visit
Admission: RE | Admit: 2018-11-12 | Discharge: 2018-11-12 | Disposition: A | Discharge status: Home / Self Care | Payer: Medicare Other | Source: Ambulatory Visit | Attending: Neurology | Admitting: Neurology

## 2018-11-12 DIAGNOSIS — M4802 Spinal stenosis, cervical region: Secondary | ICD-10-CM

## 2018-11-12 DIAGNOSIS — M4312 Spondylolisthesis, cervical region: Secondary | ICD-10-CM

## 2018-11-13 ENCOUNTER — Telehealth: Payer: Self-pay | Admitting: Neurology

## 2018-11-13 DIAGNOSIS — Z944 Liver transplant status: Secondary | ICD-10-CM

## 2018-11-13 DIAGNOSIS — M4802 Spinal stenosis, cervical region: Secondary | ICD-10-CM

## 2018-11-13 NOTE — Telephone Encounter (Signed)
I called and left a message that I took look at the x-ray with flexion and extension.  MRI of the cervical spine had shown new spondylolisthesis at C3-C4.  On flexion he has slight worsening of the anterolisthesis.  As he has been having much more pain and there is some instability at that level I would like him to see a neurosurgeon for another opinion.  He also appears to have potential for right C7 nerve root compression on the MRI.  He has had a liver transplant and will find out from him if he would prefer to see a local neurosurgeon or 1 at the center he had a liver transplant.

## 2018-11-26 NOTE — Telephone Encounter (Signed)
I spoke to Allen Schultz about the x-rays.  They show that the anterolisthesis at C3-C4 increased with flexion.  As he also has moderate spinal stenosis at that level, I recommend that he get an opinion from neurosurgery.  He has had a liver transplant at [facility] and it would be best to arrange a consultation there.  He actually feels pain is doing better now than it was last month.

## 2019-01-07 DIAGNOSIS — Z79899 Other long term (current) drug therapy: Secondary | ICD-10-CM

## 2019-01-07 DIAGNOSIS — Z944 Liver transplant status: Secondary | ICD-10-CM

## 2019-01-21 DIAGNOSIS — Z79899 Other long term (current) drug therapy: Secondary | ICD-10-CM

## 2019-01-21 DIAGNOSIS — Z944 Liver transplant status: Secondary | ICD-10-CM

## 2019-01-28 DIAGNOSIS — Z944 Liver transplant status: Secondary | ICD-10-CM

## 2019-01-28 MED ORDER — MYCOPHENOLATE SODIUM 180 MG TABLET,DELAYED RELEASE
ORAL_TABLET | Freq: Two times a day (BID) | ORAL | 3 refills | 90.00000 days | Status: CP
Start: 2019-01-28 — End: 2020-01-28

## 2019-02-04 DIAGNOSIS — Z79899 Other long term (current) drug therapy: Principal | ICD-10-CM

## 2019-02-04 DIAGNOSIS — Z944 Liver transplant status: Principal | ICD-10-CM

## 2019-02-05 ENCOUNTER — Emergency Department (HOSPITAL_COMMUNITY): Payer: Medicare Other

## 2019-02-05 ENCOUNTER — Encounter (HOSPITAL_COMMUNITY): Payer: Self-pay | Admitting: Emergency Medicine

## 2019-02-05 ENCOUNTER — Inpatient Hospital Stay (HOSPITAL_COMMUNITY): Payer: Medicare Other

## 2019-02-05 ENCOUNTER — Inpatient Hospital Stay (HOSPITAL_COMMUNITY)
Admission: EM | Admit: 2019-02-05 | Discharge: 2019-02-07 | DRG: 065 | Disposition: A | Discharge status: Home / Self Care | Payer: Medicare Other | Attending: Internal Medicine | Admitting: Internal Medicine

## 2019-02-05 DIAGNOSIS — N183 Chronic kidney disease, stage 3 unspecified: Secondary | ICD-10-CM | POA: Diagnosis present

## 2019-02-05 DIAGNOSIS — R4701 Aphasia: Secondary | ICD-10-CM | POA: Diagnosis present

## 2019-02-05 DIAGNOSIS — Z886 Allergy status to analgesic agent status: Secondary | ICD-10-CM

## 2019-02-05 DIAGNOSIS — I639 Cerebral infarction, unspecified: Secondary | ICD-10-CM | POA: Diagnosis present

## 2019-02-05 DIAGNOSIS — Z79899 Other long term (current) drug therapy: Secondary | ICD-10-CM

## 2019-02-05 DIAGNOSIS — E1140 Type 2 diabetes mellitus with diabetic neuropathy, unspecified: Secondary | ICD-10-CM | POA: Diagnosis present

## 2019-02-05 DIAGNOSIS — R410 Disorientation, unspecified: Secondary | ICD-10-CM

## 2019-02-05 DIAGNOSIS — Z794 Long term (current) use of insulin: Secondary | ICD-10-CM

## 2019-02-05 DIAGNOSIS — Z944 Liver transplant status: Secondary | ICD-10-CM | POA: Diagnosis not present

## 2019-02-05 DIAGNOSIS — R29810 Facial weakness: Secondary | ICD-10-CM | POA: Diagnosis present

## 2019-02-05 DIAGNOSIS — R4182 Altered mental status, unspecified: Secondary | ICD-10-CM | POA: Diagnosis present

## 2019-02-05 DIAGNOSIS — Z888 Allergy status to other drugs, medicaments and biological substances status: Secondary | ICD-10-CM

## 2019-02-05 DIAGNOSIS — I6522 Occlusion and stenosis of left carotid artery: Secondary | ICD-10-CM | POA: Diagnosis not present

## 2019-02-05 DIAGNOSIS — R29701 NIHSS score 1: Secondary | ICD-10-CM | POA: Diagnosis present

## 2019-02-05 DIAGNOSIS — E785 Hyperlipidemia, unspecified: Secondary | ICD-10-CM | POA: Diagnosis present

## 2019-02-05 DIAGNOSIS — K219 Gastro-esophageal reflux disease without esophagitis: Secondary | ICD-10-CM | POA: Diagnosis present

## 2019-02-05 DIAGNOSIS — Z7982 Long term (current) use of aspirin: Secondary | ICD-10-CM | POA: Diagnosis not present

## 2019-02-05 DIAGNOSIS — Z87891 Personal history of nicotine dependence: Secondary | ICD-10-CM | POA: Diagnosis not present

## 2019-02-05 DIAGNOSIS — I6389 Other cerebral infarction: Secondary | ICD-10-CM | POA: Diagnosis not present

## 2019-02-05 DIAGNOSIS — E1122 Type 2 diabetes mellitus with diabetic chronic kidney disease: Secondary | ICD-10-CM | POA: Diagnosis present

## 2019-02-05 DIAGNOSIS — Z7902 Long term (current) use of antithrombotics/antiplatelets: Secondary | ICD-10-CM | POA: Diagnosis not present

## 2019-02-05 DIAGNOSIS — Z8249 Family history of ischemic heart disease and other diseases of the circulatory system: Secondary | ICD-10-CM

## 2019-02-05 DIAGNOSIS — Z809 Family history of malignant neoplasm, unspecified: Secondary | ICD-10-CM | POA: Diagnosis not present

## 2019-02-05 DIAGNOSIS — E7800 Pure hypercholesterolemia, unspecified: Secondary | ICD-10-CM | POA: Diagnosis present

## 2019-02-05 DIAGNOSIS — Z20828 Contact with and (suspected) exposure to other viral communicable diseases: Secondary | ICD-10-CM | POA: Diagnosis present

## 2019-02-05 DIAGNOSIS — I129 Hypertensive chronic kidney disease with stage 1 through stage 4 chronic kidney disease, or unspecified chronic kidney disease: Secondary | ICD-10-CM | POA: Diagnosis present

## 2019-02-05 DIAGNOSIS — I440 Atrioventricular block, first degree: Secondary | ICD-10-CM | POA: Diagnosis not present

## 2019-02-05 DIAGNOSIS — Z85038 Personal history of other malignant neoplasm of large intestine: Secondary | ICD-10-CM | POA: Diagnosis not present

## 2019-02-05 DIAGNOSIS — R011 Cardiac murmur, unspecified: Secondary | ICD-10-CM | POA: Diagnosis not present

## 2019-02-05 DIAGNOSIS — I6300 Cerebral infarction due to thrombosis of unspecified precerebral artery: Secondary | ICD-10-CM | POA: Diagnosis not present

## 2019-02-05 DIAGNOSIS — I63342 Cerebral infarction due to thrombosis of left cerebellar artery: Secondary | ICD-10-CM | POA: Diagnosis not present

## 2019-02-05 LAB — COMPREHENSIVE METABOLIC PANEL
ALT: 34 U/L (ref 0–44)
AST: 67 U/L — ABNORMAL HIGH (ref 15–41)
Albumin: 2.6 g/dL — ABNORMAL LOW (ref 3.5–5.0)
Alkaline Phosphatase: 184 U/L — ABNORMAL HIGH (ref 38–126)
Anion gap: 9 (ref 5–15)
BUN: 16 mg/dL (ref 8–23)
CO2: 23 mmol/L (ref 22–32)
Calcium: 9.1 mg/dL (ref 8.9–10.3)
Chloride: 107 mmol/L (ref 98–111)
Creatinine, Ser: 1.46 mg/dL — ABNORMAL HIGH (ref 0.61–1.24)
GFR calc Af Amer: 57 mL/min — ABNORMAL LOW (ref >60)
GFR calc non Af Amer: 49 mL/min — ABNORMAL LOW (ref >60)
Glucose, Bld: 180 mg/dL — ABNORMAL HIGH (ref 70–99)
Potassium: 4.4 mmol/L (ref 3.5–5.1)
Sodium: 139 mmol/L (ref 135–145)
Total Bilirubin: 2.3 mg/dL — ABNORMAL HIGH (ref 0.3–1.2)
Total Protein: 6.5 g/dL (ref 6.5–8.1)

## 2019-02-05 LAB — ETHANOL: Alcohol, Ethyl (B): <10 mg/dL (ref <10)

## 2019-02-05 LAB — URINALYSIS, ROUTINE W REFLEX MICROSCOPIC
Bilirubin Urine: NEGATIVE
Glucose, UA: NEGATIVE mg/dL
Ketones, ur: NEGATIVE mg/dL
Leukocytes,Ua: NEGATIVE
Nitrite: NEGATIVE
Protein, ur: NEGATIVE mg/dL
Specific Gravity, Urine: 1.021 (ref 1.005–1.030)
pH: 5 (ref 5.0–8.0)

## 2019-02-05 LAB — AMMONIA: Ammonia: 33 umol/L (ref 9–35)

## 2019-02-05 LAB — CBC
HCT: 36.1 % — ABNORMAL LOW (ref 39.0–52.0)
Hemoglobin: 11.5 g/dL — ABNORMAL LOW (ref 13.0–17.0)
MCH: 31 pg (ref 26.0–34.0)
MCHC: 31.9 g/dL (ref 30.0–36.0)
MCV: 97.3 fL (ref 80.0–100.0)
Platelets: 88 10*3/uL — ABNORMAL LOW (ref 150–400)
RBC: 3.71 MIL/uL — ABNORMAL LOW (ref 4.22–5.81)
RDW: 14.9 % (ref 11.5–15.5)
WBC: 4.9 10*3/uL (ref 4.0–10.5)
nRBC: 0 % (ref 0.0–0.2)

## 2019-02-05 LAB — CBG MONITORING, ED: Glucose-Capillary: 159 mg/dL — ABNORMAL HIGH (ref 70–99)

## 2019-02-05 LAB — SARS CORONAVIRUS 2 (TAT 6-24 HRS): SARS Coronavirus 2: NEGATIVE

## 2019-02-05 LAB — PROTIME-INR
INR: 1.3 — ABNORMAL HIGH (ref 0.8–1.2)
Prothrombin Time: 16.2 seconds — ABNORMAL HIGH (ref 11.4–15.2)

## 2019-02-05 LAB — GLUCOSE, CAPILLARY: Glucose-Capillary: 103 mg/dL — ABNORMAL HIGH (ref 70–99)

## 2019-02-05 LAB — APTT: aPTT: 38 seconds — ABNORMAL HIGH (ref 24–36)

## 2019-02-05 LAB — VITAMIN B12: Vitamin B-12: 974 pg/mL — ABNORMAL HIGH (ref 180–914)

## 2019-02-05 LAB — TSH: TSH: 1.243 u[IU]/mL (ref 0.350–4.500)

## 2019-02-05 LAB — HIV ANTIBODY (ROUTINE TESTING W REFLEX): HIV Screen 4th Generation wRfx: NONREACTIVE

## 2019-02-05 LAB — FOLATE: Folate: 15.9 ng/mL (ref >5.9)

## 2019-02-05 MED ORDER — PRAVASTATIN SODIUM 40 MG PO TABS: 80.0000 mg | ORAL_TABLET | Freq: Every day | ORAL | Status: DC

## 2019-02-05 MED ORDER — STROKE: EARLY STAGES OF RECOVERY BOOK
Freq: Once | Status: AC
  Administered 2019-02-05: 21:00:00
  Filled 2019-02-05: qty 1

## 2019-02-05 MED ORDER — SODIUM BICARBONATE 650 MG PO TABS
1300.0000 mg | ORAL_TABLET | Freq: Two times a day (BID) | ORAL | Status: DC
  Administered 2019-02-05 – 2019-02-07 (×4): 1300 mg via ORAL
  Filled 2019-02-05 (×4): qty 2

## 2019-02-05 MED ORDER — ACETAMINOPHEN 650 MG RE SUPP: 650.0000 mg | Freq: Four times a day (QID) | RECTAL | Status: DC | PRN

## 2019-02-05 MED ORDER — PRAVASTATIN SODIUM 40 MG PO TABS
80.0000 mg | ORAL_TABLET | Freq: Every day | ORAL | Status: DC
  Administered 2019-02-05 – 2019-02-06 (×2): 80 mg via ORAL
  Filled 2019-02-05 (×2): qty 2

## 2019-02-05 MED ORDER — SODIUM CHLORIDE 0.9% FLUSH: 3.0000 mL | Freq: Once | INTRAVENOUS | Status: DC

## 2019-02-05 MED ORDER — CLOPIDOGREL BISULFATE 75 MG PO TABS
75.0000 mg | ORAL_TABLET | Freq: Every day | ORAL | Status: DC
  Administered 2019-02-05 – 2019-02-06 (×2): 75 mg via ORAL
  Filled 2019-02-05 (×2): qty 1

## 2019-02-05 MED ORDER — INSULIN ASPART 100 UNIT/ML ~~LOC~~ SOLN
0.0000 [IU] | SUBCUTANEOUS | Status: DC
  Administered 2019-02-06: 1 [IU] via SUBCUTANEOUS
  Administered 2019-02-06 – 2019-02-07 (×2): 2 [IU] via SUBCUTANEOUS

## 2019-02-05 MED ORDER — MYCOPHENOLATE SODIUM 180 MG PO TBEC
180.0000 mg | DELAYED_RELEASE_TABLET | Freq: Two times a day (BID) | ORAL | Status: DC
  Administered 2019-02-05 – 2019-02-07 (×4): 180 mg via ORAL
  Filled 2019-02-05 (×5): qty 1

## 2019-02-05 MED ORDER — SODIUM CHLORIDE 0.9 % IV SOLN
100.0000 mL/h | INTRAVENOUS | Status: AC
  Administered 2019-02-05: 100 mL/h via INTRAVENOUS

## 2019-02-05 MED ORDER — ASPIRIN EC 81 MG PO TBEC
81.0000 mg | DELAYED_RELEASE_TABLET | Freq: Every day | ORAL | Status: DC
  Administered 2019-02-05 – 2019-02-06 (×2): 81 mg via ORAL
  Filled 2019-02-05 (×2): qty 1

## 2019-02-05 MED ORDER — ACETAMINOPHEN 325 MG PO TABS: 650.0000 mg | ORAL_TABLET | Freq: Four times a day (QID) | ORAL | Status: DC | PRN

## 2019-02-05 MED ORDER — SENNOSIDES-DOCUSATE SODIUM 8.6-50 MG PO TABS: 1.0000 | ORAL_TABLET | Freq: Every evening | ORAL | Status: DC | PRN

## 2019-02-05 MED ORDER — GABAPENTIN 300 MG PO CAPS
300.0000 mg | ORAL_CAPSULE | Freq: Three times a day (TID) | ORAL | Status: DC
  Administered 2019-02-05 – 2019-02-07 (×6): 300 mg via ORAL
  Filled 2019-02-05 (×6): qty 1

## 2019-02-05 MED ORDER — SODIUM CHLORIDE 0.9 % IV BOLUS
500.0000 mL | Freq: Once | INTRAVENOUS | Status: AC
  Administered 2019-02-05: 500 mL via INTRAVENOUS

## 2019-02-05 MED ORDER — HEPARIN SODIUM (PORCINE) 5000 UNIT/ML IJ SOLN: 5000.0000 [IU] | Freq: Three times a day (TID) | INTRAMUSCULAR | Status: DC

## 2019-02-05 MED ORDER — HEPARIN SODIUM (PORCINE) 5000 UNIT/ML IJ SOLN
5000.0000 [IU] | Freq: Three times a day (TID) | INTRAMUSCULAR | Status: DC
  Administered 2019-02-05 – 2019-02-07 (×6): 5000 [IU] via SUBCUTANEOUS
  Filled 2019-02-05 (×6): qty 1

## 2019-02-05 MED ORDER — EZETIMIBE 10 MG PO TABS: 10.0000 mg | ORAL_TABLET | Freq: Every day | ORAL | Status: DC

## 2019-02-05 NOTE — Discharge Instructions (Addendum)
Allen Schultz,  Thank you for choosing [facility] for your medical maintenance. 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 Incidentally, your L internal carotid was also found to be stenosed and vascular surgery was consulted for their recommendation. They have elected to proceed with surgery, and you should expect a call from their office on 02/08/2019. You will be discharged with aspirin and plavix, please take these medications as labeled. Please come back for reevaluation if you experience any changes in mental status, sudden onset weakness, shortness of breath, loss of speech, or a rapid decline in your health.

## 2019-02-05 NOTE — H&P (Signed)
Date: 02/05/2019               Patient Name:  Allen Schultz MRN: [id number]  DOB: 08/14/1950 Age / Sex: 68 y.o., male   PCP: Houston Siren., MD         Medical Service: Internal Medicine Teaching Service         Attending Physician: Dr. Bartholomew Crews, MD    First Contact: Dr. Gilford Rile Pager: [phone]  Second Contact: Dr. Myrtie Hawk Pager: [phone]       After Hours (After 5p/  First Contact Pager: [phone]  weekends / holidays): Second Contact Pager: [phone]   Chief Complaint: Confusion  History of Present Illness:  Allen Schultz is a 68 y/o male, with a PMHx of liver transplant, HTN, HLD, DM, depression, and colon cancer, who presents to the [facility] with confusion. Per his brother, they spoke on the phone when Alisha began to struggle finding words, and not making sense. His brother states that it was like "talking to a stoned person." Allen Schultz stated that he was just tired that evening and went to bed. On Monday, his brother spoke with Allen Schultz again, but did not note the issues from the previous night. He called his brother this morning, and noticed Eland having difficulty finding words and remembering events. His brother promptly brought Allen Schultz to the hospital. Per [name] brother, the patient is generally very particular about his verbiage. The patient denies changes in vision, weakness, chest pain, heart palpitations, nausea, vomiting, diarrhea, constipation, new medications.   Also of note, the patient has a liver transplant from [facility] in 1996 due to unintentional tylenol use.   During his time in the ED, his CT confirmed a indeterminately aged infarct in the left thalamus. An acute 2 cm infarction affecting the anterolateral thalamus. Neurology was consulted to assist with Allen Schultz case. Also of note, his Protime-INR showed a PT of 16.2 and INR of 1.3, APTT of 38(H), Ammonia of 33 (N), and a TSH of 1.243  Meds:  No outpatient medications have been marked  as taking for the 02/05/19 encounter [facility] Encounter).     Allergies: Allergies as of 02/05/2019 - Review Complete 02/05/2019  Allergen Reaction Noted  . Clinoril [sulindac] Swelling 10/14/2016  . Statins Other (See Comments) 10/14/2016   Past Medical History:  Diagnosis Date  . Acid reflux   . Anxiety   . Colon cancer ([place])   . Colon polyp   . Depression   . Diabetes mellitus without complication ([place])   . Heart murmur   . High cholesterol   . Hypertension   . Liver transplant recipient [facility])   . Neuropathy     Family History:  Mother with history of  cancer.  Father with history of Heart disease and HTN   Social History:  Denies smoking, EtOH, and illicit drug use.   Review of Systems: A complete ROS was negative except as per HPI.   Physical Exam: Blood pressure (!) 129/50, pulse (!) 57, temperature 98.3 F (36.8 C), resp. rate (!) 21, SpO2 98 %. Physical Exam Vitals signs and nursing note reviewed.  Constitutional:      General: He is not in acute distress.    Appearance: Normal appearance. He is not ill-appearing, toxic-appearing or diaphoretic.     Comments: Patient has trouble finding and placing words. He is able to participate with no slurred speech.   HENT:     Head: Normocephalic and atraumatic.  Eyes:  Extraocular Movements: Extraocular movements intact.     Conjunctiva/sclera: Conjunctivae normal.     Pupils: Pupils are equal, round, and reactive to light.  Cardiovascular:     Rate and Rhythm: Normal rate and regular rhythm.     Pulses: Normal pulses.     Heart sounds: Murmur present. No friction rub. No gallop.      Comments: Grade III systolic murmur noted in all post.  Pulmonary:     Effort: Pulmonary effort is normal.     Breath sounds: Normal breath sounds. No wheezing, rhonchi or rales.  Abdominal:     General: Abdomen is flat. Bowel sounds are normal.     Palpations: Abdomen is soft.     Tenderness: There is no abdominal  tenderness. There is no guarding.  Musculoskeletal:     Right lower leg: Edema present.     Left lower leg: Edema present.     Comments: 3+ Pitting edema in the lower extremities bilaterally.   Skin:    General: Skin is warm.     Findings: No lesion or rash.  Neurological:     Mental Status: He is alert.     Cranial Nerves: No cranial nerve deficit.     Sensory: No sensory deficit.     Motor: No weakness.     Comments: Alert to self, place, and time.  Difficulty remembering recent events.  5/5 strength in upper and lower extremities bilaterally. CN II-XII intact.     EKG: personally reviewed my interpretation is 1st degree block with PVCs in sinus rhythm  MRI:  Shows Acute 2 cm infarction affecting the anterolateral left thalamus. No evidence of hemorrhage or mass effect.  MRI Brain wo Contrast:  EXAM: MRI HEAD WITHOUT CONTRAST  TECHNIQUE: Multiplanar, multiecho pulse sequences of the brain and surrounding structures were obtained without intravenous contrast.  COMPARISON:  Head CT earlier same day.  FINDINGS: Brain: Diffusion imaging shows an acute 2 cm infarction affecting the anterior and lateral left thalamus. No other acute infarction. Mild chronic small-vessel changes affect the pons. No focal cerebellar insult. Cerebral hemispheres elsewhere show age related volume loss with minimal small vessel change of the white matter. No mass, hemorrhage, hydrocephalus or extra-axial collection.  Vascular: Major vessels at the base of the brain show flow.  Skull and upper cervical spine: Negative  Sinuses/Orbits: Clear/normal  Other: None  IMPRESSION: Acute 2 cm infarction affecting the anterolateral left thalamus. No evidence of hemorrhage or mass effect.  Assessment & Plan by Problem: Active Problems:   Stroke [facility]) Assessment:  Allen Schultz is a 68 y/o male with a PMH of liver transplant, HTN, HLD, DM, depression, and colon cancer who presents  to [facility] with a stroke.   Allen Schultz presents to the ED with new onset confusion over 72 hours with imaging consistent with a stroke. While the most likely cause of his memory loss/confusion is from his thalamic stroke, it is important to find the source of his stroke. Given his history of liver transplant, and increasing INR of 1.3 from 1.17, an increased AST of 67 with a total bilirubin of 2.3, may show some damage to his transplanted liver. Additionally, the common complications of liver transplant like HTN, HLD, DM create an environment for additional stroke risk. While his hepatic transplant may be the underlying etiology of his stroke, we should also rule out common causes of stroke, I.e. cardiological phenomenon. During his physical exam Allen Schultz had a grade III systolic  murmur, he is a 68 year old male that is not on a statin, likely 2/2 to having a liver transplant, which may point to either a cardiac source, or possibility of ischemia from the carotids. The patient will be admitted and monitored.   Plan:   Anterolateral Left Thalamic Stroke:  - Start Plavix 75 mg QD - Start ASA 81 mg QD - Ezetimibe 10 mg - Pravastatin 80 mg - Ordered MR Angio Neck wo contrast - Neurology is consulting this patient. We appreciate their assistance with [name] care.   - TTE  - Permissive HTN 220/120  - NPO until passing stroke swallow screen.   - Lipid panel   - HgA1c - Continue Telemetry  - Fall precautions  - Swallow screen   Liver Transplant:  - Trend CMPs - Continue Mycophenolate - NaCl Bolus 500 mL given - Maintenance fluid 100 mL/hr - Maintain I/Os  Dispo: Admit patient to Inpatient with expected length of stay greater than 2 midnights.  Signed: Maudie Mercury, MD 02/05/2019, 7:26 PM  Pager: [phone]

## 2019-02-05 NOTE — ED Notes (Signed)
Pt transported to MRI 

## 2019-02-05 NOTE — ED Provider Notes (Signed)
Signout from Dr. Ashok Cordia.  69 year old male with new confusion over the course of a few days.  Work-up so far has been unremarkable.  He is pending an MRI brain.  Plan is if MRI unremarkable the patient can be discharged to follow-up outpatient with neurology. Physical Exam  BP (!) 125/47   Pulse (!) 58   Temp 98.3 F (36.8 C)   Resp 20   SpO2 100%   Physical Exam  ED Course/Procedures     Procedures  MDM  Patient's MRI showing a left thalamic acute infarct.  I have consulted neurology Dr. Lorraine Lax is recommended the patient be admitted for further work-up.  I reviewed this with the patient and he is agreeable to plan.  Discussed with the internal medicine team for admission.       Hayden Rasmussen, MD 02/06/19 [phone]

## 2019-02-05 NOTE — ED Provider Notes (Addendum)
[place] EMERGENCY DEPARTMENT Provider Note   CSN: [id number] Arrival date & time: 02/05/19  1202     History   Chief Complaint Chief Complaint  Patient presents with  . Altered Mental Status    HPI Allen Schultz is a 68 y.o. male.     Patient noted with increased confusion in the past few days. Patient with remote hx liver transplant 1996. Patient denies any specific c/o, says feels fine, denies any specific symptom, including confusion. Brother has noted when talking to patient on phone past few days that he seems newly confused (states sharp, normal mental status at baseline), with occasional answering of questions in non-sensical, confused manner, disoriented to periods of time. Patient with no insight or recognition of these symptoms. No report of trauma or fall. No headaches. No neck pain or stiffness. No change in speech or vision. No numbness/weakness or change in functional ability, was able to drive self to work today. No hx etoh or substance abuse. No recent new meds or change in meds, denies overuse or increased use of any current and/or sedating meds. No dysuria or frequency. +? Mildly decreased urination today, has not urinated since before 8 am today, but normal up to that point. Feels as if able to empty bladder fully   The history is provided by the patient and a relative. The history is limited by the condition of the patient.  Altered Mental Status Presenting symptoms: confusion   Associated symptoms: no abdominal pain, no fever, no headaches, no rash, no vomiting and no weakness     Past Medical History:  Diagnosis Date  . Acid reflux   . Anxiety   . Colon cancer ([place])   . Colon polyp   . Depression   . Diabetes mellitus without complication ([place])   . Heart murmur   . High cholesterol   . Hypertension   . Liver transplant recipient [facility])   . Neuropathy     Patient Active Problem List   Diagnosis Date Noted  . Liver transplant  recipient [facility]) 10/15/2016  . Immunosuppressed status ([place]) 10/15/2016  . CKD (chronic kidney disease), stage III 10/15/2016  . Fever and chills 10/15/2016  . Bladder wall thickening 10/15/2016  . Thrombocytopenia ([place]) 10/15/2016  . Depression with anxiety 10/15/2016  . Diabetes mellitus, type II, insulin dependent ([place]) 10/15/2016  . Immunocompromised [facility])     Past Surgical History:  Procedure Laterality Date  . CHOLECYSTECTOMY    . COLON SURGERY    . [place]  . LIVER TRANSPLANT  1996  . TONSILLECTOMY          Home Medications    Prior to Admission medications   Medication Sig Start Date End Date Taking? Authorizing Provider  clotrimazole (LOTRIMIN) 1 % cream Apply 1 application topically 2 (two) times daily.    [provider]  cyclobenzaprine (FLEXERIL) 10 MG tablet Take 10 mg by mouth as needed for muscle spasms.    [provider]  ezetimibe (ZETIA) 10 MG tablet Take 10 mg by mouth daily.    [provider]  famotidine (PEPCID) 20 MG tablet Take 20 mg by mouth daily.    [provider]  gabapentin (NEURONTIN) 300 MG capsule Take 1 capsule (300 mg total) by mouth 3 (three) times daily. 10/08/18   Sater, Nanine Means, MD  insulin lispro protamine-lispro (HUMALOG 75/25 MIX) (75-25) 100 UNIT/ML SUSP injection Inject 20-30 Units into the skin 2 (two) times daily before  a meal.    [provider]  loperamide (IMODIUM) 2 MG capsule Take 6 mg by mouth 2 (two) times daily.    [provider]  mupirocin ointment (BACTROBAN) 2 % Apply 1 application topically 2 (two) times daily as needed (wound care).    [provider]  mycophenolate (MYFORTIC) 180 MG EC tablet Take 180 mg by mouth 2 (two) times daily.    [provider]  nebivolol (BYSTOLIC) 5 MG tablet Take 5 mg by mouth daily.    [provider]  Ondansetron HCl (ZOFRAN PO) Take by mouth as needed.    [provider]  Patiromer  Sorbitex Calcium (VELTASSA) 16.8 g PACK Take by mouth.    [provider]  sildenafil (VIAGRA) 100 MG tablet Take 100 mg by mouth daily as needed for erectile dysfunction.    [provider]  sodium bicarbonate 650 MG tablet Take 1,300 mg by mouth 2 (two) times daily.    [provider]  Sodium Fluoride (PREVIDENT 5000 BOOSTER PLUS) 1.1 % PSTE Place 1 application onto teeth 2 (two) times daily.    [provider]  tamsulosin (FLOMAX) 0.4 MG CAPS capsule Take 0.4 mg by mouth daily.    [provider]  traMADol (ULTRAM) 50 MG tablet Take 50 mg by mouth 2 (two) times daily as needed (pain).    [provider]  Vitamin D, Ergocalciferol, (DRISDOL) 1.25 MG (50000 UT) CAPS capsule Take 50,000 Units by mouth every 7 (seven) days.    [provider]    Family History Family History  Problem Relation Age of Onset  . Cancer Mother   . Heart disease Father   . Hypertension Father     Social History Social History   Tobacco Use  . Smoking status: Former Smoker    Types: Cigarettes  . Smokeless tobacco: Never Used  Substance Use Topics  . Alcohol use: No  . Drug use: No     Allergies   Clinoril [sulindac] and Statins   Review of Systems Review of Systems  Constitutional: Negative for fever.  HENT: Negative for sore throat.   Eyes: Negative for pain and visual disturbance.  Respiratory: Negative for cough and shortness of breath.   Cardiovascular: Negative for chest pain.  Gastrointestinal: Negative for abdominal pain, diarrhea and vomiting.  Endocrine: Negative for polyuria.  Genitourinary: Negative for dysuria and flank pain.  Musculoskeletal: Negative for back pain, neck pain and neck stiffness.  Skin: Negative for rash.  Neurological: Negative for speech difficulty, weakness, numbness and headaches.  Hematological: Does not bruise/bleed easily.  Psychiatric/Behavioral: Positive for confusion.     Physical Exam  Updated Vital Signs BP (!) 142/49 (BP Location: Left Arm)   Pulse 61   Temp 98.3 F (36.8 C)   Resp 20   SpO2 96%   Physical Exam Vitals signs and nursing note reviewed.  Constitutional:      Appearance: Normal appearance. He is well-developed.  HENT:     Head: Atraumatic.     Nose: Nose normal.     Mouth/Throat:     Mouth: Mucous membranes are moist.     Pharynx: Oropharynx is clear.  Eyes:     General: No scleral icterus.    Conjunctiva/sclera: Conjunctivae normal.     Pupils: Pupils are equal, round, and reactive to light.  Neck:     Musculoskeletal: Normal range of motion and neck supple. No neck rigidity or muscular tenderness.     Vascular:  No carotid bruit.     Trachea: No tracheal deviation.     Comments: Thyroid not grossly enlarged or tender. No stiffness or rigidity.  Cardiovascular:     Rate and Rhythm: Normal rate and regular rhythm.     Pulses: Normal pulses.     Heart sounds: Murmur present. No friction rub. No gallop.   Pulmonary:     Effort: Pulmonary effort is normal. No accessory muscle usage or respiratory distress.     Breath sounds: Normal breath sounds.  Abdominal:     General: Bowel sounds are normal. There is no distension.     Palpations: Abdomen is soft. There is no mass.     Tenderness: There is no abdominal tenderness. There is no guarding.  Genitourinary:    Comments: No cva tenderness. Musculoskeletal:     Right lower leg: Edema present.     Left lower leg: Edema present.  Lymphadenopathy:     Cervical: No cervical adenopathy.  Skin:    General: Skin is warm and dry.     Findings: No rash.  Neurological:     Mental Status: He is alert.     Comments: Alert, speech clear. Motor intact bil, stre 5/5. sens grossly itnact bil. Patient is alert, oriented to person, place, and day, but not date or year.   Psychiatric:        Mood and Affect: Mood normal.      ED Treatments / Results  Labs (all labs ordered are listed, but only  abnormal results are displayed) Results for orders placed or performed during the hospital encounter of 02/05/19  Comprehensive metabolic panel  Result Value Ref Range   Sodium 139 135 - 145 mmol/L   Potassium 4.4 3.5 - 5.1 mmol/L   Chloride 107 98 - 111 mmol/L   CO2 23 22 - 32 mmol/L   Glucose, Bld 180 (H) 70 - 99 mg/dL   BUN 16 8 - 23 mg/dL   Creatinine, Ser 1.46 (H) 0.61 - 1.24 mg/dL   Calcium 9.1 8.9 - 10.3 mg/dL   Total Protein 6.5 6.5 - 8.1 g/dL   Albumin 2.6 (L) 3.5 - 5.0 g/dL   AST 67 (H) 15 - 41 U/L   ALT 34 0 - 44 U/L   Alkaline Phosphatase 184 (H) 38 - 126 U/L   Total Bilirubin 2.3 (H) 0.3 - 1.2 mg/dL   GFR calc non Af Amer 49 (L) >60 mL/min   GFR calc Af Amer 57 (L) >60 mL/min   Anion gap 9 5 - 15  CBC  Result Value Ref Range   WBC 4.9 4.0 - 10.5 K/uL   RBC 3.71 (L) 4.22 - 5.81 MIL/uL   Hemoglobin 11.5 (L) 13.0 - 17.0 g/dL   HCT 36.1 (L) 39.0 - 52.0 %   MCV 97.3 80.0 - 100.0 fL   MCH 31.0 26.0 - 34.0 pg   MCHC 31.9 30.0 - 36.0 g/dL   RDW 14.9 11.5 - 15.5 %   Platelets 88 (L) 150 - 400 K/uL   nRBC 0.0 0.0 - 0.2 %  Protime-INR  Result Value Ref Range   Prothrombin Time 16.2 (H) 11.4 - 15.2 seconds   INR 1.3 (H) 0.8 - 1.2  APTT  Result Value Ref Range   aPTT 38 (H) 24 - 36 seconds  Ammonia  Result Value Ref Range   Ammonia 33 9 - 35 umol/L  CBG monitoring, ED  Result Value Ref Range   Glucose-Capillary 159 (H) 70 -  99 mg/dL   EKG EKG Interpretation  Date/Time:  Tuesday February 05 2019 12:54:51 EDT Ventricular Rate:  66 PR Interval:  210 QRS Duration: 106 QT Interval:  428 QTC Calculation: 448 R Axis:   -31 Text Interpretation:  Sinus rhythm with 1st degree A-V block with Premature supraventricular complexes Left axis deviation Left ventricular hypertrophy Confirmed by Lajean Saver [phone]) on 02/05/2019 1:38:42 PM   Radiology Ct Head Wo Contrast  Addendum Date: 02/05/2019   ADDENDUM REPORT: 02/05/2019 14:43 ADDENDUM: These results were called  by telephone at the time of interpretation on 02/05/2019 at 2:43 pm to provider [facility] , who verbally acknowledged these results. Electronically Signed   By: Zetta Bills M.D.   On: 02/05/2019 14:43   Result Date: 02/05/2019 CLINICAL DATA:  Altered level of consciousness, possible stroke. EXAM: CT HEAD WITHOUT CONTRAST TECHNIQUE: Contiguous axial images were obtained from the base of the skull through the vertex without intravenous contrast. COMPARISON:  No comparison imaging is available. FINDINGS: Brain: Area of low attenuation in the left thalamus along the internal capsule. Subtle basal ganglia calcifications. No signs of intracranial hemorrhage, mass or mass effect. No midline shift. Vascular: No hyperdense vessel or unexpected calcification. Skull: Normal. Negative for fracture or focal lesion. Sinuses/Orbits: No acute finding. Other: None. IMPRESSION: 1. Infarct of indeterminate age in the left thalamus. Consider MRI for further assessment as clinically warranted. No priors are available for comparison and reports from remote priors of 2003 show no mention of a similar finding or a finding that would explain this abnormality. 2. A call is out to the referring provider to discuss findings in the case above. Electronically Signed: By: Zetta Bills M.D. On: 02/05/2019 14:36    Procedures Procedures (including critical care time)  Medications Ordered in ED Medications  sodium chloride flush (NS) 0.9 % injection 3 mL (has no administration in time range)  sodium chloride flush (NS) 0.9 % injection 3 mL (has no administration in time range)     Initial Impression / Assessment and Plan / ED Course  I have reviewed the triage vital signs and the nursing notes.  Pertinent labs & imaging results that were available during my care of the patient were reviewed by me and considered in my medical decision making (see chart for details).  Iv ns. Continuous pulse ox and monitoring.   Stat  labs and imaging.   Reviewed nursing notes and prior charts for additional history.   Radiologist contact me in ED - states abn noted on CT today not present on prior imaging, and recommends MRI. MRI ordered.   Labs reviewed by me - chem normal.  Ammonia normal.  MRI is pending - signed out to Dr Melina Copa to check MR result, recheck pt, and dispo appropriately.     Final Clinical Impressions(s) / ED Diagnoses   Final diagnoses:  None    ED Discharge Orders    None          Lajean Saver, MD 02/05/19 [phone]

## 2019-02-05 NOTE — Consult Note (Addendum)
Neurology Consultation  Reason for Consult: Stroke Referring Physician: Melina Copa  History is obtained from: Patient's brother  HPI: Jaquavion Leftridge is a 68 y.o. male with history of neuropathy, liver transplant, hypertension, hypercholesterolemia, diabetes, heart murmur and anxiety.  Per patient's brother, patient was noted on Sunday night to be having word finding difficulties.  On Monday throughout the day he continued to have some word finding difficulties and was brought to the hospital today secondary to the same.  He denies any numbness, tingling, weakness, difficulty eating and or visual difficulties.  Patient denies currently smoking, drinking and does not take aspirin.  Due to the above symptoms patient had MRI which did show a left thalamic infarct.  for that reason neurology was consulted. ED course CT head and MRI of brain  LKW: Unknown specific time however per brother at some point during the day on 11 October tpa given?: no, out of window with minimal symptoms at this time Premorbid modified Rankin scale (mRS): 0 NIH scale 1 for facial droop  ROS: A 14 point ROS was performed and is negative except as noted in the HPI.   Past Medical History:  Diagnosis Date  . Acid reflux   . Anxiety   . Colon cancer ([place])   . Colon polyp   . Depression   . Diabetes mellitus without complication ([place])   . Heart murmur   . High cholesterol   . Hypertension   . Liver transplant recipient [facility])   . Neuropathy     Family History  Problem Relation Age of Onset  . Cancer Mother   . Heart disease Father   . Hypertension Father     Social History:   reports that he has quit smoking. His smoking use included cigarettes. He has never used smokeless tobacco. He reports that he does not drink alcohol or use drugs.  Medications  Current Facility-Administered Medications:  .  sodium chloride flush (NS) 0.9 % injection 3 mL, 3 mL, Intravenous, Once, Lajean Saver, MD .  sodium chloride  flush (NS) 0.9 % injection 3 mL, 3 mL, Intravenous, Once, Lajean Saver, MD  Current Outpatient Medications:  .  clotrimazole (LOTRIMIN) 1 % cream, Apply 1 application topically 2 (two) times daily., Disp: , Rfl:  .  cyclobenzaprine (FLEXERIL) 10 MG tablet, Take 10 mg by mouth as needed for muscle spasms., Disp: , Rfl:  .  ezetimibe (ZETIA) 10 MG tablet, Take 10 mg by mouth daily., Disp: , Rfl:  .  famotidine (PEPCID) 20 MG tablet, Take 20 mg by mouth daily., Disp: , Rfl:  .  gabapentin (NEURONTIN) 300 MG capsule, Take 1 capsule (300 mg total) by mouth 3 (three) times daily., Disp: 90 capsule, Rfl: 5 .  insulin lispro protamine-lispro (HUMALOG 75/25 MIX) (75-25) 100 UNIT/ML SUSP injection, Inject 20-30 Units into the skin 2 (two) times daily before a meal., Disp: , Rfl:  .  loperamide (IMODIUM) 2 MG capsule, Take 6 mg by mouth 2 (two) times daily., Disp: , Rfl:  .  mupirocin ointment (BACTROBAN) 2 %, Apply 1 application topically 2 (two) times daily as needed (wound care)., Disp: , Rfl:  .  mycophenolate (MYFORTIC) 180 MG EC tablet, Take 180 mg by mouth 2 (two) times daily., Disp: , Rfl:  .  nebivolol (BYSTOLIC) 5 MG tablet, Take 5 mg by mouth daily., Disp: , Rfl:  .  Ondansetron HCl (ZOFRAN PO), Take by mouth as needed., Disp: , Rfl:  .  Patiromer Sorbitex Calcium (VELTASSA) 16.8  g PACK, Take by mouth., Disp: , Rfl:  .  sildenafil (VIAGRA) 100 MG tablet, Take 100 mg by mouth daily as needed for erectile dysfunction., Disp: , Rfl:  .  sodium bicarbonate 650 MG tablet, Take 1,300 mg by mouth 2 (two) times daily., Disp: , Rfl:  .  Sodium Fluoride (PREVIDENT 5000 BOOSTER PLUS) 1.1 % PSTE, Place 1 application onto teeth 2 (two) times daily., Disp: , Rfl:  .  tamsulosin (FLOMAX) 0.4 MG CAPS capsule, Take 0.4 mg by mouth daily., Disp: , Rfl:  .  traMADol (ULTRAM) 50 MG tablet, Take 50 mg by mouth 2 (two) times daily as needed (pain)., Disp: , Rfl:  .  Vitamin D, Ergocalciferol, (DRISDOL) 1.25 MG (50000  UT) CAPS capsule, Take 50,000 Units by mouth every 7 (seven) days., Disp: , Rfl:    Exam: Current vital signs: BP (!) 129/47   Pulse (!) 55   Temp 98.3 F (36.8 C)   Resp (!) 21   SpO2 99%  Vital signs in last 24 hours: Temp:  [98.3 F (36.8 C)] 98.3 F (36.8 C) (10/13 1257) Pulse Rate:  [54-61] 55 (10/13 1730) Resp:  [17-22] 21 (10/13 1730) BP: (115-142)/(47-57) 129/47 (10/13 1730) SpO2:  [96 %-100 %] 99 % (10/13 1730)  Physical Exam  Constitutional: Appears well-developed and well-nourished.  Psych: Affect appropriate to situation Eyes: No scleral injection HENT: No OP obstrucion Head: Normocephalic.  Cardiovascular: Normal rate and regular rhythm.  Respiratory: Effort normal, non-labored breathing GI: Soft.  No distension. There is no tenderness.  Skin: WDI with 3+ pitting edema in the lower extremities  Neuro: Mental Status: Patient is awake, alert, oriented to person, place, month, year, and situation. Patient is able to give a moderately good history. No signs of aphasia or neglect Cranial Nerves: II: Visual Fields are full.  III,IV, VI: EOMI without ptosis or diploplia. Pupils equal, round and reactive to light V: Facial sensation is symmetric to temperature VII: Right facial droop.  VIII: hearing is intact to voice X: Palat elevates symmetrically XI: Shoulder shrug is symmetric. XII: tongue is midline without atrophy or fasciculations.  Motor: Tone is normal. Bulk is normal. 5/5 strength was present in all four extremities.  Patient does orbit his left hand around his right hand.  I did not note any drift in either extremity Sensory: Sensation is symmetric to light touch and temperature in the arms and legs. Deep Tendon Reflexes: 2+ and symmetric in the biceps and patellae.  Plantars: Toes are downgoing bilaterally.  Cerebellar: FNF and HKS are intact bilaterally  Labs I have reviewed labs in epic and the results pertinent to this consultation  are:   CBC    Component Value Date/Time   WBC 4.9 02/05/2019 1301   RBC 3.71 (L) 02/05/2019 1301   HGB 11.5 (L) 02/05/2019 1301   HCT 36.1 (L) 02/05/2019 1301   PLT 88 (L) 02/05/2019 1301   MCV 97.3 02/05/2019 1301   MCH 31.0 02/05/2019 1301   MCHC 31.9 02/05/2019 1301   RDW 14.9 02/05/2019 1301   LYMPHSABS 1.9 10/17/2016 0521   MONOABS 0.6 10/17/2016 0521   EOSABS 0.4 10/17/2016 0521   BASOSABS 0.0 10/17/2016 0521    CMP     Component Value Date/Time   NA 139 02/05/2019 1301   K 4.4 02/05/2019 1301   CL 107 02/05/2019 1301   CO2 23 02/05/2019 1301   GLUCOSE 180 (H) 02/05/2019 1301   BUN 16 02/05/2019 1301   CREATININE 1.46 (  H) 02/05/2019 1301   CALCIUM 9.1 02/05/2019 1301   PROT 6.5 02/05/2019 1301   ALBUMIN 2.6 (L) 02/05/2019 1301   AST 67 (H) 02/05/2019 1301   ALT 34 02/05/2019 1301   ALKPHOS 184 (H) 02/05/2019 1301   BILITOT 2.3 (H) 02/05/2019 1301   GFRNONAA 49 (L) 02/05/2019 1301   GFRAA 57 (L) 02/05/2019 1301    Lipid Panel  No results found for: CHOL, TRIG, HDL, CHOLHDL, VLDL, LDLCALC, LDLDIRECT   Imaging I have reviewed the images obtained:  CT-scan of the brain-infarct of indeterminate age in the left thalamus  MRI examination of the brain-acute 2 cm infarct affecting the anterior lateral left thalamus  Etta Quill PA-C [facility] [phone]  M-F  (9:00 am- 5:00 PM)  02/05/2019, 5:59 PM     Assessment:  68 year old male presented to the hospital with 3 days of word finding difficulties that family had noticed.  On exam he does have a right facial droop and orbits left hand around right.  MRI reveals a left anteriorolateral infarct.   Impression: Left Anterolateral thalamic infarction   Recommend #CTA of head neck #Transthoracic Echo,  # Start patient on ASA 81mg  daily, Plavix 75 mg daily x 3 weeks  #Start or continue Atorvastatin 80 mg/other high intensity statin # BP goal: permissive HTN upto 220/120 mmHg # HBAIC and  Lipid profile # Telemetry monitoring # Frequent neuro checks # NPO until passes stroke swallow screen # please page stroke NP  Or  PA  Or MD from 8am -4 pm  as this patient from this time will be  followed by the stroke.   You can look them up on [email or web address]  Password [email or web address]  NEUROHOSPITALIST ADDENDUM Performed a face to face diagnostic evaluation.   I have reviewed the contents of history and physical exam as documented by PA/ARNP/Resident and agree with above documentation.  I have discussed and formulated the above plan as documented. Edits to the note have been made as needed.  Patient with hypertension, diabetes, hyperlipidemia and liver transplant presented with history of 3 days of confusion and language impairment.  On my exam has some difficulty with recent memory on examination.  No real difficulty with repeating sentences or naming objects.  Has a left thalamic stroke, anterior lateral location on MRI brain.  Etiology likely small vessel however also atheroembolic/atherosclerotic disease from PCA should be considered and less likely cardioembolic.  Recommended dual antiplatelet therapy, patient has no history of GI bleeding.    Recommendations as above    Karena Addison Editha Bridgeforth MD [facility] [phone]   If 7pm to 7am, please call on call as listed on AMION.

## 2019-02-05 NOTE — ED Notes (Signed)
Patient transported to CT 

## 2019-02-05 NOTE — ED Triage Notes (Addendum)
Pt lives   By himself and his brother came to check on him today after speaking with several times since Sunday and finding  Pt to be more aND MORE CONFUSED,  and  Found pt to be confused , pt states that he hqas been  Confused x a couple of days  States this is not normal for him  Know he has brother and that he is waiting in the car knows his name and bday  Able to walk with assist but states has had increasing weakness

## 2019-02-06 ENCOUNTER — Other Ambulatory Visit: Payer: Self-pay

## 2019-02-06 ENCOUNTER — Inpatient Hospital Stay (HOSPITAL_COMMUNITY): Payer: Medicare Other

## 2019-02-06 DIAGNOSIS — R011 Cardiac murmur, unspecified: Secondary | ICD-10-CM

## 2019-02-06 DIAGNOSIS — I63342 Cerebral infarction due to thrombosis of left cerebellar artery: Secondary | ICD-10-CM

## 2019-02-06 DIAGNOSIS — I440 Atrioventricular block, first degree: Secondary | ICD-10-CM

## 2019-02-06 DIAGNOSIS — Z944 Liver transplant status: Secondary | ICD-10-CM

## 2019-02-06 DIAGNOSIS — R410 Disorientation, unspecified: Secondary | ICD-10-CM

## 2019-02-06 DIAGNOSIS — I6300 Cerebral infarction due to thrombosis of unspecified precerebral artery: Secondary | ICD-10-CM

## 2019-02-06 DIAGNOSIS — Z7902 Long term (current) use of antithrombotics/antiplatelets: Secondary | ICD-10-CM

## 2019-02-06 DIAGNOSIS — Z79899 Other long term (current) drug therapy: Secondary | ICD-10-CM

## 2019-02-06 DIAGNOSIS — I6389 Other cerebral infarction: Secondary | ICD-10-CM

## 2019-02-06 DIAGNOSIS — I6522 Occlusion and stenosis of left carotid artery: Secondary | ICD-10-CM

## 2019-02-06 DIAGNOSIS — Z7982 Long term (current) use of aspirin: Secondary | ICD-10-CM

## 2019-02-06 LAB — COMPREHENSIVE METABOLIC PANEL
ALT: 31 U/L (ref 0–44)
AST: 64 U/L — ABNORMAL HIGH (ref 15–41)
Albumin: 2.2 g/dL — ABNORMAL LOW (ref 3.5–5.0)
Alkaline Phosphatase: 167 U/L — ABNORMAL HIGH (ref 38–126)
Anion gap: 6 (ref 5–15)
BUN: 16 mg/dL (ref 8–23)
CO2: 21 mmol/L — ABNORMAL LOW (ref 22–32)
Calcium: 8.5 mg/dL — ABNORMAL LOW (ref 8.9–10.3)
Chloride: 111 mmol/L (ref 98–111)
Creatinine, Ser: 1.13 mg/dL (ref 0.61–1.24)
GFR calc Af Amer: >60 mL/min (ref >60)
GFR calc non Af Amer: >60 mL/min (ref >60)
Glucose, Bld: 97 mg/dL (ref 70–99)
Potassium: 3.8 mmol/L (ref 3.5–5.1)
Sodium: 138 mmol/L (ref 135–145)
Total Bilirubin: 1.9 mg/dL — ABNORMAL HIGH (ref 0.3–1.2)
Total Protein: 5.8 g/dL — ABNORMAL LOW (ref 6.5–8.1)

## 2019-02-06 LAB — GLUCOSE, CAPILLARY
Glucose-Capillary: 92 mg/dL (ref 70–99)
Glucose-Capillary: 95 mg/dL (ref 70–99)
Glucose-Capillary: 141 mg/dL — ABNORMAL HIGH (ref 70–99)
Glucose-Capillary: 114 mg/dL — ABNORMAL HIGH (ref 70–99)
Glucose-Capillary: 165 mg/dL — ABNORMAL HIGH (ref 70–99)

## 2019-02-06 LAB — HEMOGLOBIN A1C
Hgb A1c MFr Bld: 6.1 % — ABNORMAL HIGH (ref 4.8–5.6)
Mean Plasma Glucose: 128.37 mg/dL

## 2019-02-06 LAB — CBC
HCT: 32.4 % — ABNORMAL LOW (ref 39.0–52.0)
HCT: 32.1 % — ABNORMAL LOW (ref 39.0–52.0)
Hemoglobin: 10.6 g/dL — ABNORMAL LOW (ref 13.0–17.0)
Hemoglobin: 11 g/dL — ABNORMAL LOW (ref 13.0–17.0)
MCH: 30.7 pg (ref 26.0–34.0)
MCH: 31.9 pg (ref 26.0–34.0)
MCHC: 32.7 g/dL (ref 30.0–36.0)
MCHC: 34.3 g/dL (ref 30.0–36.0)
MCV: 93.9 fL (ref 80.0–100.0)
MCV: 93 fL (ref 80.0–100.0)
Platelets: 77 10*3/uL — ABNORMAL LOW (ref 150–400)
Platelets: 80 10*3/uL — ABNORMAL LOW (ref 150–400)
RBC: 3.45 MIL/uL — ABNORMAL LOW (ref 4.22–5.81)
RBC: 3.45 MIL/uL — ABNORMAL LOW (ref 4.22–5.81)
RDW: 14.9 % (ref 11.5–15.5)
RDW: 14.7 % (ref 11.5–15.5)
WBC: 4.4 10*3/uL (ref 4.0–10.5)
WBC: 5.1 10*3/uL (ref 4.0–10.5)
nRBC: 0 % (ref 0.0–0.2)
nRBC: 0 % (ref 0.0–0.2)

## 2019-02-06 LAB — LIPID PANEL
Cholesterol: 131 mg/dL (ref 0–200)
HDL: 41 mg/dL (ref >40)
LDL Cholesterol: 78 mg/dL (ref 0–99)
Total CHOL/HDL Ratio: 3.2 RATIO
Triglycerides: 59 mg/dL (ref <150)
VLDL: 12 mg/dL (ref 0–40)

## 2019-02-06 LAB — PROTIME-INR
INR: 1.4 — ABNORMAL HIGH (ref 0.8–1.2)
Prothrombin Time: 16.8 seconds — ABNORMAL HIGH (ref 11.4–15.2)

## 2019-02-06 LAB — APTT: aPTT: 39 seconds — ABNORMAL HIGH (ref 24–36)

## 2019-02-06 LAB — LACTATE DEHYDROGENASE: LDH: 151 U/L (ref 98–192)

## 2019-02-06 LAB — SAVE SMEAR(SSMR), FOR PROVIDER SLIDE REVIEW

## 2019-02-06 LAB — RPR: RPR Ser Ql: NONREACTIVE

## 2019-02-06 NOTE — Progress Notes (Addendum)
Subjective:  Allen Schultz was seen at bedside this morning. He states that he is doing well today and has no complaints at this time. We are currently waiting on an echo, but did speak with him concerning his angio neck which shows 60-65% stenosis of the Left ICA. We spoke about reaching out to vascular surgery for a consult for possible stenting. He was amendable to the plan. All questions and concerns were addressed.    Objective:  Vital signs in last 24 hours: Vitals:   02/05/19 2346 02/06/19 0043 02/06/19 0346 02/06/19 0500  BP: (!) 134/53 (!) 136/54 (!) 135/56   Pulse: (!) 58 60 (!) 56   Resp: 17 18 18    Temp: 99 F (37.2 C) 99.1 F (37.3 C) 98.7 F (37.1 C)   TempSrc: Oral Oral Oral   SpO2: 95% 96% 96%   Weight:    122 kg   Physical Exam Constitutional:      General: He is not in acute distress.    Appearance: Normal appearance. He is not ill-appearing, toxic-appearing or diaphoretic.     Comments: Patient sitting comfortably in a chair. No acute distress.   HENT:     Head: Normocephalic and atraumatic.  Cardiovascular:     Rate and Rhythm: Normal rate and regular rhythm.     Pulses: Normal pulses.     Heart sounds: Murmur present. No friction rub. No gallop.      Comments: Grade III murmur.  Pulmonary:     Effort: Pulmonary effort is normal.     Breath sounds: Normal breath sounds. No wheezing, rhonchi or rales.  Abdominal:     General: Abdomen is flat. Bowel sounds are normal.     Tenderness: There is no abdominal tenderness. There is no guarding.  Neurological:     General: No focal deficit present.     Mental Status: He is alert and oriented to person, place, and time.  Psychiatric:        Mood and Affect: Mood normal.        Behavior: Behavior normal.        Thought Content: Thought content normal.   IMPRESSION: 1. Left carotid bifurcation atherosclerosis with 60-65 % Left ICA origin stenosis. 2. Otherwise negative neck MRA; tortuous proximal great  vessels and vertebral arteries. 3. Negative intracranial MRA.   Assessment/Plan:  Active Problems:   Stroke [facility])  Plan:   Anterolateral Left Thalamic Stroke:  - Continue Plavix 75 mg QD - Continue  ASA 81 mg QD - Ezetimibe 10 mg - Pravastatin 80 mg - Neurology is consulting this patient. We appreciate their assistance with Allen Schultz care.              - TTE             - Permissive HTN 220/120             - NPO until passing stroke swallow screen.              - Lipid panel: T: 131 HDL: 41 LDL: 78             - HgA1c: 6.1 - Telemetry  - Fall precautions  - Spoke with Neurology, they recommend consulting vascular for a consultation and outpatient follow up.   Liver Transplant:  - CMP: Stable from yesterday - Continue Mycophenolate - Maintenance fluid 100 mL/hr - Maintain I/Os  Dispo: Anticipated discharge pending medical course.   Maudie Mercury, MD 02/06/2019, 6:43 AM  Pager: [phone]

## 2019-02-06 NOTE — Consult Note (Addendum)
[name] NOTE   MRN : [id number]  Reason for Consult: Left carotid stenosis Referring Physician:   History of Present Illness: 68 y/o male reported to have Aphasia symptoms that started Sunday night.  He denise any other symptoms of weakness, loss of vision or swallowing difficulties.  No history of irregular heart rate or rhythm, no history of TIA or stroke.  Past medical history includes:   neuropathy, liver transplant, hypertension, hypercholesterolemia, diabetes, heart murmur and anxiety.     Current Facility-Administered Medications  Medication Dose Route Frequency Provider Last Rate Last Dose  . aspirin EC tablet 81 mg  81 mg Oral Daily Masoudi, Elhamalsadat, MD   81 mg at 02/05/19 2121  . clopidogrel (PLAVIX) tablet 75 mg  75 mg Oral Daily Masoudi, Elhamalsadat, MD   75 mg at 02/05/19 2120  . gabapentin (NEURONTIN) capsule 300 mg  300 mg Oral TID Masoudi, Elhamalsadat, MD   300 mg at 02/06/19 1005  . heparin injection 5,000 Units  5,000 Units Subcutaneous Q8H Masoudi, Elhamalsadat, MD   5,000 Units at 02/06/19 1235  . insulin aspart (novoLOG) injection 0-9 Units  0-9 Units Subcutaneous Q4H Masoudi, Elhamalsadat, MD   1 Units at 02/06/19 1234  . mycophenolate (MYFORTIC) EC tablet 180 mg  180 mg Oral BID Masoudi, Elhamalsadat, MD   180 mg at 02/06/19 1005  . pravastatin (PRAVACHOL) tablet 80 mg  80 mg Oral QHS Harvie Heck, MD   80 mg at 02/05/19 2253  . senna-docusate (Senokot-S) tablet 1 tablet  1 tablet Oral QHS PRN Masoudi, Elhamalsadat, MD      . sodium bicarbonate tablet 1,300 mg  1,300 mg Oral BID Masoudi, Elhamalsadat, MD   1,300 mg at 02/06/19 1005  . sodium chloride flush (NS) 0.9 % injection 3 mL  3 mL Intravenous Once Lajean Saver, MD        Pt meds include: Statin :Yes Betablocker: Yes ASA: Yes Other anticoagulants/antiplatelets: Plavix  Past Medical History:  Diagnosis Date  . Acid reflux   . Anxiety   . Colon cancer  ([place])   . Colon polyp   . Depression   . Diabetes mellitus without complication ([place])   . Heart murmur   . High cholesterol   . Hypertension   . Liver transplant recipient [facility])   . Neuropathy     Past Surgical History:  Procedure Laterality Date  . CHOLECYSTECTOMY    . COLON SURGERY    . [place]  . LIVER TRANSPLANT  1996  . TONSILLECTOMY      Social History Social History   Tobacco Use  . Smoking status: Former Smoker    Types: Cigarettes  . Smokeless tobacco: Never Used  Substance Use Topics  . Alcohol use: No  . Drug use: No    Family History Family History  Problem Relation Age of Onset  . Cancer Mother   . Heart disease Father   . Hypertension Father     Allergies  Allergen Reactions  . Clinoril [Sulindac] Swelling    Lip swelling  . Statins Other (See Comments)    Due to transplant     REVIEW OF SYSTEMS  General: [ ]  Weight loss, [ ]  Fever, [ ]  chills Neurologic: [ ]  Dizziness, [ ]  Blackouts, [ ]  Seizure [ ]  Stroke, [ ]  "Mini stroke", [ ]  Slurred speech, [ ]  Temporary blindness; [ ]  weakness in arms or legs, [ ]  Hoarseness [ ]  Dysphagia Cardiac: [ ]   Chest pain/pressure, [ ]  Shortness of breath at rest [ ]  Shortness of breath with exertion, [ ]  Atrial fibrillation or irregular heartbeat  Vascular: [ ]  Pain in legs with walking, [ ]  Pain in legs at rest, [ ]  Pain in legs at night,  [ ]  Non-healing ulcer, [ ]  Blood clot in vein/DVT,   Pulmonary: [ ]  Home oxygen, [ ]  Productive cough, [ ]  Coughing up blood, [ ]  Asthma,  [ ]  Wheezing [ ]  COPD Musculoskeletal:  [ ]  Arthritis, [ ]  Low back pain, [ ]  Joint pain Hematologic: [ ]  Easy Bruising, [ ]  Anemia; [ ]  Hepatitis Gastrointestinal: [ ]  Blood in stool, [ ]  Gastroesophageal Reflux/heartburn, Urinary: [ ]  chronic Kidney disease, [ ]  on HD - [ ]  MWF or [ ]  TTHS, [ ]  Burning with urination, [ ]  Difficulty urinating Skin: [ ]  Rashes, [ ]  Wounds Psychological: [ ]  Anxiety, [ ]   Depression  Physical Examination Vitals:   02/06/19 0346 02/06/19 0500 02/06/19 0908 02/06/19 1159  BP: (!) 135/56  (!) 127/56 134/64  Pulse: (!) 56  (!) 55 61  Resp: 18  20 20   Temp: 98.7 F (37.1 C)  98.9 F (37.2 C) 99 F (37.2 C)  TempSrc: Oral  Oral Oral  SpO2: 96%  98% 99%  Weight:  122 kg     Body mass index is 38.05 kg/m.  General:  WDWN in NAD Gait: Normal HENT: WNL Eyes: Pupils equal Pulmonary: normal non-labored breathing , without Rales, rhonchi,  wheezing Cardiac: RRR, with  Murmurs, rubs or gallops; No carotid bruits verses murmur Abdomen: soft, NT, no masses Skin: no rashes, ulcers noted;  no Gangrene , no cellulitis; no open wounds;   Vascular Exam/Pulses:Palpable radial, DP, PT B   Musculoskeletal: no muscle wasting or atrophy; B LE edema  Neurologic: A&O X 3; Appropriate Affect ;  SENSATION: normal; MOTOR FUNCTION: 5/5 Symmetric Speech is fluent/normal   Significant Diagnostic Studies: CBC Lab Results  Component Value Date   WBC 4.4 02/06/2019   HGB 10.6 (L) 02/06/2019   HCT 32.4 (L) 02/06/2019   MCV 93.9 02/06/2019   PLT 77 (L) 02/06/2019    BMET    Component Value Date/Time   NA 138 02/06/2019 0505   K 3.8 02/06/2019 0505   CL 111 02/06/2019 0505   CO2 21 (L) 02/06/2019 0505   GLUCOSE 97 02/06/2019 0505   BUN 16 02/06/2019 0505   CREATININE 1.13 02/06/2019 0505   CALCIUM 8.5 (L) 02/06/2019 0505   GFRNONAA >60 02/06/2019 0505   GFRAA >60 02/06/2019 0505   Estimated Creatinine Clearance: 83.7 mL/min (by C-G formula based on SCr of 1.13 mg/dL).  COAG Lab Results  Component Value Date   INR 1.4 (H) 02/06/2019   INR 1.3 (H) 02/05/2019   INR 1.17 10/17/2016     Non-Invasive Vascular Imaging:  EXAM: CT HEAD WITHOUT CONTRAST  TECHNIQUE: Contiguous axial images were obtained from the base of the skull through the vertex without intravenous contrast.  COMPARISON:  No comparison imaging is available.  FINDINGS: Brain:  Area of low attenuation in the left thalamus along the internal capsule. Subtle basal ganglia calcifications.  No signs of intracranial hemorrhage, mass or mass effect. No midline shift.  Vascular: No hyperdense vessel or unexpected calcification.  Skull: Normal. Negative for fracture or focal lesion.  Sinuses/Orbits: No acute finding.  Other: None.  IMPRESSION: 1. Infarct of indeterminate age in the left thalamus. Consider MRI for further assessment as clinically warranted.  No priors are available for comparison and reports from remote priors of 2003 show no mention of a similar finding or a finding that would explain this abnormality. 2. A call is out to the referring provider to discuss findings in the case above.  FINDINGS: MRA NECK FINDINGS  Time-of-flight images demonstrate tortuous proximal great vessels, antegrade flow in the bilateral cervical carotid and vertebral arteries to the skull base.  Tortuous proximal right CCA appears within normal limits. Negative right carotid bifurcation and cervical right ICA.  Tortuous proximal left CCA appears within normal limits. At the left carotid bifurcation there is irregularity and stenosis of the proximal left ICA, numerically estimated at 60-65 % with respect to the distal vessel (series 4, image 62). Distal to the origin the left ICA appears normal to the skull base.  Both vertebral arteries are patent in the neck. Grossly normal bilateral vertebral artery origins with tortuous bilateral V1 segments. Codominant V2 segments without stenosis. The V3 segments appear within normal limits.  MRA HEAD FINDINGS  No intracranial mass effect or ventriculomegaly.  Antegrade flow in the distal vertebral arteries with patent PICA origins and no distal vertebral stenosis. The left V4 segment is mildly dominant. Normal vertebrobasilar junction and basilar artery. Normal SCA and right PCA origins. Fetal type left PCA  origin. Right posterior communicating artery diminutive or absent. Bilateral PCA branches are within normal limits.  Symmetric antegrade flow in both ICA siphons. No siphon stenosis. Normal ophthalmic and left posterior communicating artery origins. Patent carotid termini. Normal MCA and ACA origins. Anterior communicating artery and visible ACA branches are within normal limits. Left MCA M1 segment and trifurcation are patent without stenosis. Right MCA M1 segment and bifurcation are patent without stenosis. Visible bilateral MCA branches are within normal limits.  IMPRESSION: 1. Left carotid bifurcation atherosclerosis with 60-65 % Left ICA origin stenosis. 2. Otherwise negative neck MRA; tortuous proximal great vessels and vertebral arteries. 3. Negative intracranial MRA.  Pending carotid duplex  ASSESSMENT/PLAN:  Left carotid stenosis Symptomatic aphasia Infarct of indeterminate age in the left thalamus We will follow carotid duplex for further information and make recommendations.  Roxy Horseman 02/06/2019 1:09 PM   I have examined the patient, reviewed and agree with above.  Discussed the MRI findings with the patient and his brother present.  Does appear to have moderate to severe 60 to 65% left internal carotid artery stenosis at its origin with probable symptoms related to this.  I did explain that there is occasional significant overcall and degree of stenosis production with MRSA and await carotid duplex.  If this is confirmed, would recommend left carotid endarterectomy for reduction of stroke risk.  Explained that this would be done on an urgent but not emergent basis and could be discharged home following the duplex with a plan for readmission within the next 1 to 2 weeks for surgery.  I explained the surgery in detail including 1 to 2% possibility of stroke with surgery.  Patient understands this plan.  Will make final recommendations pending duplex  Allen Jews, MD 02/06/2019 2:45 PM   mnklm,

## 2019-02-06 NOTE — TOC Initial Note (Signed)
Transition of Care [facility]) - Initial/Assessment Note    Patient Details  Name: Allen Schultz MRN: [id number] Date of Birth: Sep 14, 1950  Transition of Care [facility]) CM/SW Contact:    Pollie Friar, RN Phone Number: 02/06/2019, 4:08 PM  Clinical Narrative:                 Pt states he has a friend: Richardson Landry that can check in on him. He denies any DME at home. He also denies any issues with home medications and currently drives.  TOC following for further d/c needs.   Expected Discharge Plan: Home/Self Care Barriers to Discharge: Continued Medical Work up   Patient Goals and CMS Choice        Expected Discharge Plan and Services Expected Discharge Plan: Home/Self Care                                              Prior Living Arrangements/Services   Lives with:: Self Patient language and need for interpreter reviewed:: Yes(no needs) Do you feel safe going back to the place where you live?: Yes        Care giver support system in place?: No (comment)   Criminal Activity/Legal Involvement Pertinent to Current Situation/Hospitalization: No - Comment as needed  Activities of Daily Living Home Assistive Devices/Equipment: None ADL Screening (condition at time of admission) Patient's cognitive ability adequate to safely complete daily activities?: Yes Is the patient deaf or have difficulty hearing?: No Does the patient have difficulty seeing, even when wearing glasses/contacts?: No Does the patient have difficulty concentrating, remembering, or making decisions?: No Patient able to express need for assistance with ADLs?: Yes Does the patient have difficulty dressing or bathing?: No Independently performs ADLs?: Yes (appropriate for developmental age) Does the patient have difficulty walking or climbing stairs?: No Weakness of Legs: None Weakness of Arms/Hands: None  Permission Sought/Granted                  Emotional Assessment Appearance:: Appears stated  age Attitude/Demeanor/Rapport: Engaged Affect (typically observed): Accepting, Pleasant Orientation: : Oriented to Self, Oriented to Place, Oriented to  Time, Oriented to Situation   Psych Involvement: No (comment)  Admission diagnosis:  Confusion [R41.0] Stroke ([place]) [I63.9] History of liver transplant ([place]) [Z94.4] Altered mental status, unspecified altered mental status type [R41.82] Cerebrovascular accident (CVA), unspecified mechanism ([place]) [I63.9] Patient Active Problem List   Diagnosis Date Noted  . Stroke ([place]) 02/05/2019  . Liver transplant recipient [facility]) 10/15/2016  . Immunosuppressed status ([place]) 10/15/2016  . CKD (chronic kidney disease), stage III 10/15/2016  . Fever and chills 10/15/2016  . Bladder wall thickening 10/15/2016  . Thrombocytopenia ([place]) 10/15/2016  . Depression with anxiety 10/15/2016  . Diabetes mellitus, type II, insulin dependent ([place]) 10/15/2016  . Immunocompromised ([place])    PCP:  Houston Siren., MD Pharmacy:   [facility] [place], [place] [address] [place] [address] Phone: [phone] Fax: [phone]     Social Determinants of Health (SDOH) Interventions    Readmission Risk Interventions No flowsheet data found.

## 2019-02-06 NOTE — Progress Notes (Signed)
STROKE TEAM PROGRESS NOTE   INTERVAL HISTORY I have personally reviewed history of presenting illness with the patient, electronic medical records as well as imaging films in PACS.  He presented with transient speech and language difficulties which appear to have improved.  MR angiogram shows 70% symptomatic left carotid stenosis with persistent fetal origin of the left posterior cerebral artery.  MRI shows 2 cm left thalamic infarct.  Vitals:   02/06/19 0043 02/06/19 0346 02/06/19 0500 02/06/19 0908  BP: (!) 136/54 (!) 135/56  (!) 127/56  Pulse: 60 (!) 56  (!) 55  Resp: 18 18  20   Temp: 99.1 F (37.3 C) 98.7 F (37.1 C)  98.9 F (37.2 C)  TempSrc: Oral Oral  Oral  SpO2: 96% 96%  98%  Weight:   122 kg     CBC:  Recent Labs  Lab 02/05/19 1301 02/06/19 0505  WBC 4.9 4.4  HGB 11.5* 10.6*  HCT 36.1* 32.4*  MCV 97.3 93.9  PLT 88* 77*    Basic Metabolic Panel:  Recent Labs  Lab 02/05/19 1301 02/06/19 0505  NA 139 138  K 4.4 3.8  CL 107 111  CO2 23 21*  GLUCOSE 180* 97  BUN 16 16  CREATININE 1.46* 1.13  CALCIUM 9.1 8.5*   Lipid Panel:     Component Value Date/Time   CHOL 131 02/06/2019 0505   TRIG 59 02/06/2019 0505   HDL 41 02/06/2019 0505   CHOLHDL 3.2 02/06/2019 0505   VLDL 12 02/06/2019 0505   LDLCALC 78 02/06/2019 0505   HgbA1c:  Lab Results  Component Value Date   HGBA1C 6.1 (H) 02/06/2019   Urine Drug Screen: No results found for: LABOPIA, COCAINSCRNUR, LABBENZ, AMPHETMU, THCU, LABBARB  Alcohol Level     Component Value Date/Time   ETH <10 02/05/2019 1940    IMAGING Ct Head Wo Contrast  Addendum Date: 02/05/2019   ADDENDUM REPORT: 02/05/2019 14:43 ADDENDUM: These results were called by telephone at the time of interpretation on 02/05/2019 at 2:43 pm to provider [facility] , who verbally acknowledged these results. Electronically Signed   By: Zetta Bills M.D.   On: 02/05/2019 14:43   Result Date: 02/05/2019 CLINICAL DATA:  Altered level of  consciousness, possible stroke. EXAM: CT HEAD WITHOUT CONTRAST TECHNIQUE: Contiguous axial images were obtained from the base of the skull through the vertex without intravenous contrast. COMPARISON:  No comparison imaging is available. FINDINGS: Brain: Area of low attenuation in the left thalamus along the internal capsule. Subtle basal ganglia calcifications. No signs of intracranial hemorrhage, mass or mass effect. No midline shift. Vascular: No hyperdense vessel or unexpected calcification. Skull: Normal. Negative for fracture or focal lesion. Sinuses/Orbits: No acute finding. Other: None. IMPRESSION: 1. Infarct of indeterminate age in the left thalamus. Consider MRI for further assessment as clinically warranted. No priors are available for comparison and reports from remote priors of 2003 show no mention of a similar finding or a finding that would explain this abnormality. 2. A call is out to the referring provider to discuss findings in the case above. Electronically Signed: By: Zetta Bills M.D. On: 02/05/2019 14:36   Mr Angio Head Wo Contrast  Result Date: 02/05/2019 CLINICAL DATA:  68 year old male with acute left thalamic infarct on MRI earlier today. EXAM: MRA HEAD WITHOUT CONTRAST MRA NECK WITHOUT CONTRAST TECHNIQUE: Angiographic images of the Circle of Willis were obtained using MRA technique without intravenous contrast. Angiographic images of the neck were obtained using MRA technique  without intravenous contrast. Carotid stenosis measurements (when applicable) are obtained utilizing NASCET criteria, using the distal internal carotid diameter as the denominator. COMPARISON:  Brain MRI earlier today. FINDINGS: MRA NECK FINDINGS Time-of-flight images demonstrate tortuous proximal great vessels, antegrade flow in the bilateral cervical carotid and vertebral arteries to the skull base. Tortuous proximal right CCA appears within normal limits. Negative right carotid bifurcation and cervical right  ICA. Tortuous proximal left CCA appears within normal limits. At the left carotid bifurcation there is irregularity and stenosis of the proximal left ICA, numerically estimated at 60-65 % with respect to the distal vessel (series 4, image 62). Distal to the origin the left ICA appears normal to the skull base. Both vertebral arteries are patent in the neck. Grossly normal bilateral vertebral artery origins with tortuous bilateral V1 segments. Codominant V2 segments without stenosis. The V3 segments appear within normal limits. MRA HEAD FINDINGS No intracranial mass effect or ventriculomegaly. Antegrade flow in the distal vertebral arteries with patent PICA origins and no distal vertebral stenosis. The left V4 segment is mildly dominant. Normal vertebrobasilar junction and basilar artery. Normal SCA and right PCA origins. Fetal type left PCA origin. Right posterior communicating artery diminutive or absent. Bilateral PCA branches are within normal limits. Symmetric antegrade flow in both ICA siphons. No siphon stenosis. Normal ophthalmic and left posterior communicating artery origins. Patent carotid termini. Normal MCA and ACA origins. Anterior communicating artery and visible ACA branches are within normal limits. Left MCA M1 segment and trifurcation are patent without stenosis. Right MCA M1 segment and bifurcation are patent without stenosis. Visible bilateral MCA branches are within normal limits. IMPRESSION: 1. Left carotid bifurcation atherosclerosis with 60-65 % Left ICA origin stenosis. 2. Otherwise negative neck MRA; tortuous proximal great vessels and vertebral arteries. 3. Negative intracranial MRA. Electronically Signed   By: Genevie Ann M.D.   On: 02/05/2019 22:57   Mr Angio Neck Wo Contrast  Result Date: 02/05/2019 CLINICAL DATA:  68 year old male with acute left thalamic infarct on MRI earlier today. EXAM: MRA HEAD WITHOUT CONTRAST MRA NECK WITHOUT CONTRAST TECHNIQUE: Angiographic images of the Circle  of Willis were obtained using MRA technique without intravenous contrast. Angiographic images of the neck were obtained using MRA technique without intravenous contrast. Carotid stenosis measurements (when applicable) are obtained utilizing NASCET criteria, using the distal internal carotid diameter as the denominator. COMPARISON:  Brain MRI earlier today. FINDINGS: MRA NECK FINDINGS Time-of-flight images demonstrate tortuous proximal great vessels, antegrade flow in the bilateral cervical carotid and vertebral arteries to the skull base. Tortuous proximal right CCA appears within normal limits. Negative right carotid bifurcation and cervical right ICA. Tortuous proximal left CCA appears within normal limits. At the left carotid bifurcation there is irregularity and stenosis of the proximal left ICA, numerically estimated at 60-65 % with respect to the distal vessel (series 4, image 62). Distal to the origin the left ICA appears normal to the skull base. Both vertebral arteries are patent in the neck. Grossly normal bilateral vertebral artery origins with tortuous bilateral V1 segments. Codominant V2 segments without stenosis. The V3 segments appear within normal limits. MRA HEAD FINDINGS No intracranial mass effect or ventriculomegaly. Antegrade flow in the distal vertebral arteries with patent PICA origins and no distal vertebral stenosis. The left V4 segment is mildly dominant. Normal vertebrobasilar junction and basilar artery. Normal SCA and right PCA origins. Fetal type left PCA origin. Right posterior communicating artery diminutive or absent. Bilateral PCA branches are within normal limits. Symmetric antegrade  flow in both ICA siphons. No siphon stenosis. Normal ophthalmic and left posterior communicating artery origins. Patent carotid termini. Normal MCA and ACA origins. Anterior communicating artery and visible ACA branches are within normal limits. Left MCA M1 segment and trifurcation are patent without  stenosis. Right MCA M1 segment and bifurcation are patent without stenosis. Visible bilateral MCA branches are within normal limits. IMPRESSION: 1. Left carotid bifurcation atherosclerosis with 60-65 % Left ICA origin stenosis. 2. Otherwise negative neck MRA; tortuous proximal great vessels and vertebral arteries. 3. Negative intracranial MRA. Electronically Signed   By: Genevie Ann M.D.   On: 02/05/2019 22:57   Mr Brain Wo Contrast  Result Date: 02/05/2019 CLINICAL DATA:  Unexplained altered level of consciousness. EXAM: MRI HEAD WITHOUT CONTRAST TECHNIQUE: Multiplanar, multiecho pulse sequences of the brain and surrounding structures were obtained without intravenous contrast. COMPARISON:  Head CT earlier same day. FINDINGS: Brain: Diffusion imaging shows an acute 2 cm infarction affecting the anterior and lateral left thalamus. No other acute infarction. Mild chronic small-vessel changes affect the pons. No focal cerebellar insult. Cerebral hemispheres elsewhere show age related volume loss with minimal small vessel change of the white matter. No mass, hemorrhage, hydrocephalus or extra-axial collection. Vascular: Major vessels at the base of the brain show flow. Skull and upper cervical spine: Negative Sinuses/Orbits: Clear/normal Other: None IMPRESSION: Acute 2 cm infarction affecting the anterolateral left thalamus. No evidence of hemorrhage or mass effect. Electronically Signed   By: Nelson Chimes M.D.   On: 02/05/2019 16:55   Dg Chest Port 1 View  Result Date: 02/05/2019 CLINICAL DATA:  68 year old male with concern for CVA. EXAM: PORTABLE CHEST 1 VIEW COMPARISON:  Chest radiograph dated 10/14/2016 FINDINGS: Shallow inspiration. No focal consolidation, pleural effusion, pneumothorax. Mild diffuse interstitial coarsening. Stable cardiac silhouette. No acute osseous pathology. IMPRESSION: No active disease. No interval change. Electronically Signed   By: Anner Crete M.D.   On: 02/05/2019 20:28     PHYSICAL EXAM Pleasant middle-aged Caucasian male not in distress. . Afebrile. Head is nontraumatic. Neck is supple without bruit.    Cardiac exam show harsh ejection systolic murmur heard throughout precordium.. Lungs are clear to auscultation. Distal pulses are well felt. Neurological Exam ;  Awake  Alert oriented x 3. Normal speech and language.eye movements full without nystagmus.fundi were not visualized. Vision acuity and fields appear normal. Hearing is normal. Palatal movements are normal. Face symmetric. Tongue midline. Normal strength, tone, reflexes and coordination. Normal sensation. Gait deferred.  ASSESSMENT/PLAN Allen Schultz is a 68 y.o. male with history of neuropathy, liver transplant, hypertension, hypercholesterolemia, diabetes, heart murmur and anxiety presenting with word finding difficulties.   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  Carotid Doppler  pending   2D Echo pending   LDL 78  HgbA1c 6.1  Heparin 5000 units sq tid for VTE prophylaxis  No antithrombotic prior to admission, now on aspirin 81 mg daily and clopidogrel 75 mg daily. Continue.   Therapy recommendations:  No PT  Disposition:  Return home  Carotid Stenosis, L  MRA neck L ICA bifurcation w/ 60-65% stenosis.  Carotid Doppler  pending   VVS consult  Ok for surgery from stroke standpoint  Hypertension  Stable . Permissive  hypertension (OK if < 220/120) with Goal 130-150 given ICA stenosis . Long-term BP goal normotensive  Hyperlipidemia  Home meds:  zetia 10  Intolerant to statins  LDL 78, goal < 70  Continue statin at discharge  Diabetes type II Controlled  HgbA1c 6.1, goal < 7.0  Other Stroke Risk Factors  Advanced  age  Former Cigarette smoker  Obesity, Body mass index is 38.05 kg/m., recommend weight loss, diet and exercise as appropriate   Other Active Problems  Hx liver transplant, on mycophenolate.  Hospital day # 1  I have personally obtained history,examined this patient, reviewed notes, independently viewed imaging studies, participated in medical decision making and plan of care.ROS completed by me personally and pertinent positives fully documented  I have made any additions or clarifications directly to the above note.  He presented with transient speech and language difficulties due to large left thalamic infarct likely from thromboembolism from proximal left carotid stenosis with persistent fetal pattern of origin of the left posterior cerebral artery.  Recommend aspirin and Plavix and early left carotid revascularization.  Discussed with Dr. Donnetta Hutching vascular surgeon.  Greater than 50% time during this 35-minute visit was spent on counseling and coordination of care about his stroke and discussion about treatment for carotid stenosis and answering questions.   Allen Contras, MD Medical Director [facility] Pager: [phone] 02/06/2019 6:29 PM   To contact Stroke Continuity provider, please refer to [email or web address] After hours, contact General Neurology

## 2019-02-06 NOTE — Evaluation (Signed)
Physical Therapy Evaluation Patient Details Name: Allen Schultz MRN: [id number] DOB: 1950/05/06 Today's Date: 02/06/2019   History of Present Illness  68 y.o. male with history of neuropathy, liver transplant, hypertension, hypercholesterolemia, diabetes, heart murmur and anxiety. Pt with word finding difficultiesand was found to have L thalamic infarct.  Clinical Impression  Pt presents to PT s/p L thalamic infarct with resolution of symptoms. Pt is able to perform all mobility required in the home and community independently, and appears to be at his baseline. Pt requires no further acute PT services and is encouraged to ambulate around the unit multiple times per day for the remainder of his hospitalization. Acute PT signing off.    Follow Up Recommendations No PT follow up    Equipment Recommendations  None recommended by PT    Recommendations for Other Services       Precautions / Restrictions Precautions Precautions: None Restrictions Weight Bearing Restrictions: No      Mobility  Bed Mobility Overal bed mobility: Independent                Transfers Overall transfer level: Independent Equipment used: None                Ambulation/Gait Ambulation/Gait assistance: Independent Gait Distance (Feet): 300 Feet Assistive device: None Gait Pattern/deviations: Step-through pattern Gait velocity: functional Gait velocity interpretation: 1.31 - 2.62 ft/sec, indicative of limited community ambulator General Gait Details: unremarkable gait  Stairs Stairs: Yes Stairs assistance: Modified independent (Device/Increase time) Stair Management: One rail Left Number of Stairs: 6    Wheelchair Mobility    Modified Rankin (Stroke Patients Only) Modified Rankin (Stroke Patients Only) Pre-Morbid Rankin Score: No symptoms Modified Rankin: No symptoms     Balance Overall balance assessment: Independent                                          Pertinent Vitals/Pain Pain Assessment: No/denies pain    Home Living Family/patient expects to be discharged to:: Private residence Living Arrangements: Alone Available Help at Discharge: Friend(s);Available PRN/intermittently Type of Home: Apartment Home Access: Stairs to enter Entrance Stairs-Rails: Psychiatric nurse of Steps: 7 Home Layout: One level Home Equipment: None      Prior Function Level of Independence: Independent               Hand Dominance        Extremity/Trunk Assessment   Upper Extremity Assessment Upper Extremity Assessment: Overall WFL for tasks assessed    Lower Extremity Assessment Lower Extremity Assessment: Overall WFL for tasks assessed    Cervical / Trunk Assessment Cervical / Trunk Assessment: Normal  Communication   Communication: No difficulties  Cognition Arousal/Alertness: Awake/alert Behavior During Therapy: WFL for tasks assessed/performed Overall Cognitive Status: Within Functional Limits for tasks assessed                                        General Comments      Exercises     Assessment/Plan    PT Assessment Patent does not need any further PT services  PT Problem List         PT Treatment Interventions      PT Goals (Current goals can be found in the Care Plan section)  Frequency     Barriers to discharge        Co-evaluation               AM-PAC PT "6 Clicks" Mobility  Outcome Measure Help needed turning from your back to your side while in a flat bed without using bedrails?: None Help needed moving from lying on your back to sitting on the side of a flat bed without using bedrails?: None Help needed moving to and from a bed to a chair (including a wheelchair)?: None Help needed standing up from a chair using your arms (e.g., wheelchair or bedside chair)?: None Help needed to walk in hospital room?: None Help needed climbing 3-5 steps with a  railing? : None 6 Click Score: 24    End of Session Equipment Utilized During Treatment: (none) Activity Tolerance: Patient tolerated treatment well Patient left: in chair;with call bell/phone within reach Nurse Communication: Mobility status PT Visit Diagnosis: Other symptoms and signs involving the nervous system (R29.898)    [id number] PT Time Calculation (min) (ACUTE ONLY): 16 min   Charges:   PT Evaluation $PT Eval Low Complexity: Stanley, PT, DPT Acute Rehabilitation Pager: [phone]   Zenaida Niece 02/06/2019, 10:40 AM

## 2019-02-06 NOTE — Evaluation (Addendum)
Occupational Therapy Evaluation and Discharge Patient Details Name: Allen Schultz MRN: [id number] DOB: 1951/03/24 Today's Date: 02/06/2019    History of Present Illness 68 y.o. male with history of neuropathy, liver transplant, hypertension, hypercholesterolemia, diabetes, heart murmur and anxiety. Pt with word finding difficulties. MRI showing L thalamic infarct.   Clinical Impression   PTA, pt was living alone and was independent and working. Pt currently performing near baseline function for ADLs and functional mobility. Pt presenting WFL for cognitive areas including problem solving, memory, attention, and awareness. Pt verbalizing he is still having difficulty with word finding; also noting pt's with difficulty with word finding during session. Recommend SLP consult and dc to home once medically stable per physician. All acute OT needs met and will sign off.      Follow Up Recommendations  No OT follow up    Equipment Recommendations  None recommended by OT    Recommendations for Other Services Speech consult     Precautions / Restrictions Precautions Precautions: None Restrictions Weight Bearing Restrictions: No      Mobility Bed Mobility Overal bed mobility: Independent                Transfers Overall transfer level: Independent                    Balance Overall balance assessment: Independent                                         ADL either performed or assessed with clinical judgement   ADL Overall ADL's : Modified independent                                       General ADL Comments: Increased time as needed for processing and slight fatigue. Pt performing LB dressing, tub transfer, and functional mobility in hallway     Vision Baseline Vision/History: Wears glasses Wears Glasses: At all times Patient Visual Report: No change from baseline       Perception     Praxis      Pertinent  Vitals/Pain Pain Assessment: No/denies pain     Hand Dominance Right   Extremity/Trunk Assessment Upper Extremity Assessment Upper Extremity Assessment: Overall WFL for tasks assessed   Lower Extremity Assessment Lower Extremity Assessment: Overall WFL for tasks assessed   Cervical / Trunk Assessment Cervical / Trunk Assessment: Normal   Communication Communication Communication: No difficulties   Cognition Arousal/Alertness: Awake/alert Behavior During Therapy: WFL for tasks assessed/performed Overall Cognitive Status: Within Functional Limits for tasks assessed                                 General Comments: Pt demonstrating WFL problem solving, attention, awareness, and memory. Pt able to recalling 2/3 ST memory words with one category cue. Pt able to locate different rooms.    General Comments  Pt brother present in room    Exercises     Shoulder Dargan expects to be discharged to:: Private residence Living Arrangements: Alone Available Help at Discharge: Friend(s);Available PRN/intermittently Type of Home: Apartment Home Access: Stairs to enter Entrance Stairs-Number of Steps: 7 Entrance Stairs-Rails: Right;Left Home Layout: One level  Bathroom Shower/Tub: Occupational psychologist: None          Prior Functioning/Environment Level of Independence: Independent        Comments: Works with drafting        OT Problem List: Decreased activity tolerance;Impaired balance (sitting and/or standing);Decreased cognition      OT Treatment/Interventions:      OT Goals(Current goals can be found in the care plan section) Acute Rehab OT Goals Patient Stated Goal: "Get back to my normal" OT Goal Formulation: All assessment and education complete, DC therapy  OT Frequency:     Barriers to D/C:            Co-evaluation              AM-PAC OT "6  Clicks" Daily Activity     Outcome Measure Help from another person eating meals?: None Help from another person taking care of personal grooming?: None Help from another person toileting, which includes using toliet, bedpan, or urinal?: None Help from another person bathing (including washing, rinsing, drying)?: None Help from another person to put on and taking off regular upper body clothing?: None Help from another person to put on and taking off regular lower body clothing?: None 6 Click Score: 24   End of Session Nurse Communication: Mobility status  Activity Tolerance: Patient tolerated treatment well Patient left: in chair;with call bell/phone within reach;with family/visitor present  OT Visit Diagnosis: Other abnormalities of gait and mobility (R26.89);Other symptoms and signs involving cognitive function                Time: 1339-1359 OT Time Calculation (min): 20 min Charges:  OT General Charges $OT Visit: 1 Visit OT Evaluation $OT Eval Low Complexity: [place], OTR/L Acute Rehab Pager: [phone] Office: [place] 02/06/2019, 4:08 PM

## 2019-02-06 NOTE — Progress Notes (Signed)
  Echocardiogram 2D Echocardiogram has been performed.  Allen Schultz 02/06/2019, 3:35 PM

## 2019-02-06 NOTE — Progress Notes (Signed)
  Date: 02/06/2019  Patient name: Zandyn Gilbreath  Medical record number: [id number]  Date of birth: December 25, 1950   I have seen and evaluated Ruben Reason and discussed their care with the Residency Team.  Mr. Armando is a 68 year old community dwelling gentleman with history of liver transplant who presented with confusion.  The admitting team got the history of the brother noted the patient was struggling to find words and not making sense when they spoke on the phone.  On the 12th, they spoke again without any abnormalities.  On the 13th, the patient's brother noted the difficulties again and brought the patient to the hospital.  This morning, Mr. Tosi is without complaints.  He understands the need to get a vascular consultation.  Echo is still pending, otherwise stroke work-up is complete.  Vitals:   02/06/19 0908 02/06/19 1159  BP: (!) 127/56 134/64  Pulse: (!) 55 61  Resp: 20 20  Temp: 98.9 F (37.2 C) 99 F (37.2 C)  SpO2: 98% 99%  Gen NAD, sitting in recliner Cardiovascular H RRR, loud systolic murmur throughout test L CTA B  Pertinent labs Albumin 2.2 AST 64 Total bili 2.3 - 1.9 Total cholesterol 131, LDL 78, HDL 41 Hemoglobin 10.6, MCV 94, RDW 15 Platelets 77  MRI head acute 2 cm infarct anterior lateral left thalamus MRA left carotid bifurcation atherosclerosis with 60 to 65% left ICA origin stenosis  I personally viewed the CXR images and confirmed my reading with the official read.  1 view AP portable semi-erect.  Slight rotation.  Poor inspiration.  No acute abnormalities  I personally viewed the EKG and confirmed my reading with the official read.  Sinus, LAD, first-degree AV block, LVH, no ischemic changes.  Assessment and Plan: I have seen and evaluated the patient as outlined above. I agree with the formulated Assessment and Plan as detailed in the residents' note, with the following changes: Mr. Koh is a 68 year old community dwelling gentleman with  history of liver transplant who presented with confusion and found to have an acute left anterior lateral thalamic stroke.  The etiology is felt to be small vessel but on thromboembolic or atherosclerotic from PCA is also being considered.  Cardioembolic is found to be less likely.  He is currently on dual antiplatelet therapy along with pravastatin 80 mg.  His echo is still pending as is carotid Dopplers as recommended by vascular.  His deficits seem to have resolved.  1.  Acute left anterior lateral thalamic stroke - cont dual antiplatelet, atorvastatin, follow-up echo, follow-up carotid Dopplers, and neurology's recommendations.  Dispo will depend on vascular's recommendations regarding his carotids.  Bartholomew Crews, MD 10/14/20202:48 PM

## 2019-02-06 NOTE — H&P (View-Only) (Signed)
[facility] NOTE   MRN : [id number]  Reason for Consult: Left carotid stenosis Referring Physician:   History of Present Illness: 68 y/o male reported to have Aphasia symptoms that started Sunday night.  He denise any other symptoms of weakness, loss of vision or swallowing difficulties.  No history of irregular heart rate or rhythm, no history of TIA or stroke.  Past medical history includes:   neuropathy, liver transplant, hypertension, hypercholesterolemia, diabetes, heart murmur and anxiety.     Current Facility-Administered Medications  Medication Dose Route Frequency Provider Last Rate Last Dose  . aspirin EC tablet 81 mg  81 mg Oral Daily Masoudi, Elhamalsadat, MD   81 mg at 02/05/19 2121  . clopidogrel (PLAVIX) tablet 75 mg  75 mg Oral Daily Masoudi, Elhamalsadat, MD   75 mg at 02/05/19 2120  . gabapentin (NEURONTIN) capsule 300 mg  300 mg Oral TID Masoudi, Elhamalsadat, MD   300 mg at 02/06/19 1005  . heparin injection 5,000 Units  5,000 Units Subcutaneous Q8H Masoudi, Elhamalsadat, MD   5,000 Units at 02/06/19 1235  . insulin aspart (novoLOG) injection 0-9 Units  0-9 Units Subcutaneous Q4H Masoudi, Elhamalsadat, MD   1 Units at 02/06/19 1234  . mycophenolate (MYFORTIC) EC tablet 180 mg  180 mg Oral BID Masoudi, Elhamalsadat, MD   180 mg at 02/06/19 1005  . pravastatin (PRAVACHOL) tablet 80 mg  80 mg Oral QHS Harvie Heck, MD   80 mg at 02/05/19 2253  . senna-docusate (Senokot-S) tablet 1 tablet  1 tablet Oral QHS PRN Masoudi, Elhamalsadat, MD      . sodium bicarbonate tablet 1,300 mg  1,300 mg Oral BID Masoudi, Elhamalsadat, MD   1,300 mg at 02/06/19 1005  . sodium chloride flush (NS) 0.9 % injection 3 mL  3 mL Intravenous Once Lajean Saver, MD        Pt meds include: Statin :Yes Betablocker: Yes ASA: Yes Other anticoagulants/antiplatelets: Plavix  Past Medical History:  Diagnosis Date  . Acid reflux   . Anxiety   . Colon cancer  ([place])   . Colon polyp   . Depression   . Diabetes mellitus without complication ([place])   . Heart murmur   . High cholesterol   . Hypertension   . Liver transplant recipient [facility])   . Neuropathy     Past Surgical History:  Procedure Laterality Date  . CHOLECYSTECTOMY    . COLON SURGERY    . [place]  . LIVER TRANSPLANT  1996  . TONSILLECTOMY      Social History Social History   Tobacco Use  . Smoking status: Former Smoker    Types: Cigarettes  . Smokeless tobacco: Never Used  Substance Use Topics  . Alcohol use: No  . Drug use: No    Family History Family History  Problem Relation Age of Onset  . Cancer Mother   . Heart disease Father   . Hypertension Father     Allergies  Allergen Reactions  . Clinoril [Sulindac] Swelling    Lip swelling  . Statins Other (See Comments)    Due to transplant     REVIEW OF SYSTEMS  General: [ ]  Weight loss, [ ]  Fever, [ ]  chills Neurologic: [ ]  Dizziness, [ ]  Blackouts, [ ]  Seizure [ ]  Stroke, [ ]  "Mini stroke", [ ]  Slurred speech, [ ]  Temporary blindness; [ ]  weakness in arms or legs, [ ]  Hoarseness [ ]  Dysphagia Cardiac: [ ]   Chest pain/pressure, [ ]  Shortness of breath at rest [ ]  Shortness of breath with exertion, [ ]  Atrial fibrillation or irregular heartbeat  Vascular: [ ]  Pain in legs with walking, [ ]  Pain in legs at rest, [ ]  Pain in legs at night,  [ ]  Non-healing ulcer, [ ]  Blood clot in vein/DVT,   Pulmonary: [ ]  Home oxygen, [ ]  Productive cough, [ ]  Coughing up blood, [ ]  Asthma,  [ ]  Wheezing [ ]  COPD Musculoskeletal:  [ ]  Arthritis, [ ]  Low back pain, [ ]  Joint pain Hematologic: [ ]  Easy Bruising, [ ]  Anemia; [ ]  Hepatitis Gastrointestinal: [ ]  Blood in stool, [ ]  Gastroesophageal Reflux/heartburn, Urinary: [ ]  chronic Kidney disease, [ ]  on HD - [ ]  MWF or [ ]  TTHS, [ ]  Burning with urination, [ ]  Difficulty urinating Skin: [ ]  Rashes, [ ]  Wounds Psychological: [ ]  Anxiety, [ ]   Depression  Physical Examination Vitals:   02/06/19 0346 02/06/19 0500 02/06/19 0908 02/06/19 1159  BP: (!) 135/56  (!) 127/56 134/64  Pulse: (!) 56  (!) 55 61  Resp: 18  20 20   Temp: 98.7 F (37.1 C)  98.9 F (37.2 C) 99 F (37.2 C)  TempSrc: Oral  Oral Oral  SpO2: 96%  98% 99%  Weight:  122 kg     Body mass index is 38.05 kg/m.  General:  WDWN in NAD Gait: Normal HENT: WNL Eyes: Pupils equal Pulmonary: normal non-labored breathing , without Rales, rhonchi,  wheezing Cardiac: RRR, with  Murmurs, rubs or gallops; No carotid bruits verses murmur Abdomen: soft, NT, no masses Skin: no rashes, ulcers noted;  no Gangrene , no cellulitis; no open wounds;   Vascular Exam/Pulses:Palpable radial, DP, PT B   Musculoskeletal: no muscle wasting or atrophy; B LE edema  Neurologic: A&O X 3; Appropriate Affect ;  SENSATION: normal; MOTOR FUNCTION: 5/5 Symmetric Speech is fluent/normal   Significant Diagnostic Studies: CBC Lab Results  Component Value Date   WBC 4.4 02/06/2019   HGB 10.6 (L) 02/06/2019   HCT 32.4 (L) 02/06/2019   MCV 93.9 02/06/2019   PLT 77 (L) 02/06/2019    BMET    Component Value Date/Time   NA 138 02/06/2019 0505   K 3.8 02/06/2019 0505   CL 111 02/06/2019 0505   CO2 21 (L) 02/06/2019 0505   GLUCOSE 97 02/06/2019 0505   BUN 16 02/06/2019 0505   CREATININE 1.13 02/06/2019 0505   CALCIUM 8.5 (L) 02/06/2019 0505   GFRNONAA >60 02/06/2019 0505   GFRAA >60 02/06/2019 0505   Estimated Creatinine Clearance: 83.7 mL/min (by C-G formula based on SCr of 1.13 mg/dL).  COAG Lab Results  Component Value Date   INR 1.4 (H) 02/06/2019   INR 1.3 (H) 02/05/2019   INR 1.17 10/17/2016     Non-Invasive Vascular Imaging:  EXAM: CT HEAD WITHOUT CONTRAST  TECHNIQUE: Contiguous axial images were obtained from the base of the skull through the vertex without intravenous contrast.  COMPARISON:  No comparison imaging is available.  FINDINGS: Brain:  Area of low attenuation in the left thalamus along the internal capsule. Subtle basal ganglia calcifications.  No signs of intracranial hemorrhage, mass or mass effect. No midline shift.  Vascular: No hyperdense vessel or unexpected calcification.  Skull: Normal. Negative for fracture or focal lesion.  Sinuses/Orbits: No acute finding.  Other: None.  IMPRESSION: 1. Infarct of indeterminate age in the left thalamus. Consider MRI for further assessment as clinically warranted.  No priors are available for comparison and reports from remote priors of 2003 show no mention of a similar finding or a finding that would explain this abnormality. 2. A call is out to the referring provider to discuss findings in the case above.  FINDINGS: MRA NECK FINDINGS  Time-of-flight images demonstrate tortuous proximal great vessels, antegrade flow in the bilateral cervical carotid and vertebral arteries to the skull base.  Tortuous proximal right CCA appears within normal limits. Negative right carotid bifurcation and cervical right ICA.  Tortuous proximal left CCA appears within normal limits. At the left carotid bifurcation there is irregularity and stenosis of the proximal left ICA, numerically estimated at 60-65 % with respect to the distal vessel (series 4, image 62). Distal to the origin the left ICA appears normal to the skull base.  Both vertebral arteries are patent in the neck. Grossly normal bilateral vertebral artery origins with tortuous bilateral V1 segments. Codominant V2 segments without stenosis. The V3 segments appear within normal limits.  MRA HEAD FINDINGS  No intracranial mass effect or ventriculomegaly.  Antegrade flow in the distal vertebral arteries with patent PICA origins and no distal vertebral stenosis. The left V4 segment is mildly dominant. Normal vertebrobasilar junction and basilar artery. Normal SCA and right PCA origins. Fetal type left PCA  origin. Right posterior communicating artery diminutive or absent. Bilateral PCA branches are within normal limits.  Symmetric antegrade flow in both ICA siphons. No siphon stenosis. Normal ophthalmic and left posterior communicating artery origins. Patent carotid termini. Normal MCA and ACA origins. Anterior communicating artery and visible ACA branches are within normal limits. Left MCA M1 segment and trifurcation are patent without stenosis. Right MCA M1 segment and bifurcation are patent without stenosis. Visible bilateral MCA branches are within normal limits.  IMPRESSION: 1. Left carotid bifurcation atherosclerosis with 60-65 % Left ICA origin stenosis. 2. Otherwise negative neck MRA; tortuous proximal great vessels and vertebral arteries. 3. Negative intracranial MRA.  Pending carotid duplex  ASSESSMENT/PLAN:  Left carotid stenosis Symptomatic aphasia Infarct of indeterminate age in the left thalamus We will follow carotid duplex for further information and make recommendations.  Roxy Horseman 02/06/2019 1:09 PM   I have examined the patient, reviewed and agree with above.  Discussed the MRI findings with the patient and his brother present.  Does appear to have moderate to severe 60 to 65% left internal carotid artery stenosis at its origin with probable symptoms related to this.  I did explain that there is occasional significant overcall and degree of stenosis production with MRSA and await carotid duplex.  If this is confirmed, would recommend left carotid endarterectomy for reduction of stroke risk.  Explained that this would be done on an urgent but not emergent basis and could be discharged home following the duplex with a plan for readmission within the next 1 to 2 weeks for surgery.  I explained the surgery in detail including 1 to 2% possibility of stroke with surgery.  Patient understands this plan.  Will make final recommendations pending duplex  Curt Jews, MD 02/06/2019 2:45 PM   mnklm,

## 2019-02-07 ENCOUNTER — Inpatient Hospital Stay (HOSPITAL_COMMUNITY): Payer: Medicare Other

## 2019-02-07 ENCOUNTER — Other Ambulatory Visit: Payer: Self-pay | Admitting: Internal Medicine

## 2019-02-07 DIAGNOSIS — I6300 Cerebral infarction due to thrombosis of unspecified precerebral artery: Secondary | ICD-10-CM

## 2019-02-07 LAB — COMPREHENSIVE METABOLIC PANEL
ALT: 32 U/L (ref 0–44)
AST: 66 U/L — ABNORMAL HIGH (ref 15–41)
Albumin: 2.3 g/dL — ABNORMAL LOW (ref 3.5–5.0)
Alkaline Phosphatase: 158 U/L — ABNORMAL HIGH (ref 38–126)
Anion gap: 9 (ref 5–15)
BUN: 15 mg/dL (ref 8–23)
CO2: 21 mmol/L — ABNORMAL LOW (ref 22–32)
Calcium: 8.4 mg/dL — ABNORMAL LOW (ref 8.9–10.3)
Chloride: 107 mmol/L (ref 98–111)
Creatinine, Ser: 1.14 mg/dL (ref 0.61–1.24)
GFR calc Af Amer: >60 mL/min (ref >60)
GFR calc non Af Amer: >60 mL/min (ref >60)
Glucose, Bld: 101 mg/dL — ABNORMAL HIGH (ref 70–99)
Potassium: 3.8 mmol/L (ref 3.5–5.1)
Sodium: 137 mmol/L (ref 135–145)
Total Bilirubin: 1.8 mg/dL — ABNORMAL HIGH (ref 0.3–1.2)
Total Protein: 5.7 g/dL — ABNORMAL LOW (ref 6.5–8.1)

## 2019-02-07 LAB — CBC
HCT: 33.7 % — ABNORMAL LOW (ref 39.0–52.0)
Hemoglobin: 11.7 g/dL — ABNORMAL LOW (ref 13.0–17.0)
MCH: 32.4 pg (ref 26.0–34.0)
MCHC: 34.7 g/dL (ref 30.0–36.0)
MCV: 93.4 fL (ref 80.0–100.0)
Platelets: 79 10*3/uL — ABNORMAL LOW (ref 150–400)
RBC: 3.61 MIL/uL — ABNORMAL LOW (ref 4.22–5.81)
RDW: 14.6 % (ref 11.5–15.5)
WBC: 4.9 10*3/uL (ref 4.0–10.5)
nRBC: 0 % (ref 0.0–0.2)

## 2019-02-07 LAB — GLUCOSE, CAPILLARY
Glucose-Capillary: 100 mg/dL — ABNORMAL HIGH (ref 70–99)
Glucose-Capillary: 101 mg/dL — ABNORMAL HIGH (ref 70–99)
Glucose-Capillary: 108 mg/dL — ABNORMAL HIGH (ref 70–99)
Glucose-Capillary: 164 mg/dL — ABNORMAL HIGH (ref 70–99)

## 2019-02-07 LAB — ECHOCARDIOGRAM COMPLETE: Weight: 4303.38 oz

## 2019-02-07 MED ORDER — CLOPIDOGREL BISULFATE 75 MG PO TABS: 75.0000 mg | ORAL_TABLET | Freq: Every day | ORAL | 0 refills | Status: DC

## 2019-02-07 MED ORDER — ASPIRIN 81 MG PO TBEC: 81.0000 mg | DELAYED_RELEASE_TABLET | Freq: Every day | ORAL | 0 refills | Status: AC

## 2019-02-07 MED ORDER — PRAVASTATIN SODIUM 80 MG PO TABS: 80.0000 mg | ORAL_TABLET | Freq: Every day | ORAL | 0 refills | Status: AC

## 2019-02-07 NOTE — Discharge Summary (Signed)
Name: Allen Schultz MRN: [id number] DOB: Oct 30, 1950 68 y.o. PCP: Houston Siren., MD  Date of Admission: 02/05/2019 12:37 PM Date of Discharge: 02/07/2019 Attending Physician: Bartholomew Crews, MD  Discharge Diagnosis: 1. Anterolateral Left Thalamic Stroke 2. [place] [facility] Transplant   Discharge Medications: Allergies as of 02/07/2019      Reactions   Clinoril [sulindac] Swelling   Lip swelling   Statins Other (See Comments)   Due to transplant      Medication List    STOP taking these medications   ezetimibe 10 MG tablet Commonly known as: ZETIA     TAKE these medications   aspirin 81 MG EC tablet Take 1 tablet (81 mg total) by mouth daily.   clopidogrel 75 MG tablet Commonly known as: PLAVIX Take 1 tablet (75 mg total) by mouth daily.   clotrimazole 1 % cream Commonly known as: LOTRIMIN Apply 1 application topically 2 (two) times daily.   cyclobenzaprine 10 MG tablet Commonly known as: FLEXERIL Take 10 mg by mouth 3 (three) times daily as needed for muscle spasms.   famotidine 20 MG tablet Commonly known as: PEPCID Take 20 mg by mouth daily.   gabapentin 300 MG capsule Commonly known as: NEURONTIN Take 1 capsule (300 mg total) by mouth 3 (three) times daily.   insulin lispro protamine-lispro (75-25) 100 UNIT/ML Susp injection Commonly known as: HUMALOG 75/25 MIX Inject 25 Units into the skin 2 (two) times daily with a meal.   loperamide 2 MG capsule Commonly known as: IMODIUM Take 6 mg by mouth 2 (two) times daily.   mupirocin ointment 2 % Commonly known as: BACTROBAN Apply 1 application topically 2 (two) times daily as needed (wound care).   mycophenolate 180 MG EC tablet Commonly known as: MYFORTIC Take 360 mg by mouth 2 (two) times daily.   nebivolol 5 MG tablet Commonly known as: BYSTOLIC Take 5 mg by mouth daily.   pravastatin 80 MG tablet Commonly known as: PRAVACHOL Take 1 tablet (80 mg total) by mouth at bedtime.   PreviDent  5000 Booster Plus 1.1 % Pste Generic drug: Sodium Fluoride Place 1 application onto teeth 2 (two) times daily.   sildenafil 100 MG tablet Commonly known as: VIAGRA Take 100 mg by mouth daily as needed for erectile dysfunction.   sodium bicarbonate 650 MG tablet Take 1,300 mg by mouth 2 (two) times daily.   tamsulosin 0.4 MG Caps capsule Commonly known as: FLOMAX Take 0.4 mg by mouth daily.   traMADol 50 MG tablet Commonly known as: ULTRAM Take 50 mg by mouth 2 (two) times daily as needed (pain).   Veltassa 16.8 g Pack Generic drug: Patiromer Sorbitex Calcium Take 1 packet by mouth daily.   Vitamin D (Ergocalciferol) 1.25 MG (50000 UT) Caps capsule Commonly known as: DRISDOL Take 50,000 Units by mouth every 7 (seven) days.       Disposition and follow-up:   Allen Schultz was discharged from [facility] in Stable condition.  At the hospital follow up visit please address:  1.  Left ICA atherosclerosis, continue monitoring liver function.   2.  Labs / imaging needed at time of follow-up: N/A  3.  Pending labs/ test needing follow-up: N/A  Follow-up Appointments: Follow-up Information    [facility] In 1 week.   Contact information: [address]     [place] [phone] [place] [facility] Course by problem list:  1. Anterolateral Left Thalamic Stroke Patient arrived to the ED with his brother due to new onset confusion over a phone call. Brother stated that [name] is generally very precise with his diction, and that this was new. Upon imaging it was fount that he had a left thalamic stroke. Further imaging with a carotid U/S and MRA head and neck showed atherosclerosis with 40-59% sclerosis. Neurology was consulted and he was placed on aspirin and plavix. Vascular was also consulted and they plan to perform a left carotid endarterectomy for reduction of stroke list. Patient  discharged on aspirin and plavix until his surgery.    2. PMH of Liver Transplant:  Patient is a liver transplant recipient who follows up with his outpatient provider. During his stay, his hepatic function was continually monitored with CMPs. Patient was discharged in stable condition.   Discharge Vitals:   BP 132/60 (BP Location: Right Arm)   Pulse (!) 57   Temp 99.6 F (37.6 C) (Oral)   Resp 18   Wt 119.4 kg   SpO2 97%   BMI 37.24 kg/m   Pertinent Labs, Studies, and Procedures:  EXAM: MRA HEAD WITHOUT CONTRAST  MRA NECK WITHOUT CONTRAST  TECHNIQUE: Angiographic images of the Circle of Willis were obtained using MRA technique without intravenous contrast. Angiographic images of the neck were obtained using MRA technique without intravenous contrast. Carotid stenosis measurements (when applicable) are obtained utilizing NASCET criteria, using the distal internal carotid diameter as the denominator.  COMPARISON:  Brain MRI earlier today.  FINDINGS: MRA NECK FINDINGS  Time-of-flight images demonstrate tortuous proximal great vessels, antegrade flow in the bilateral cervical carotid and vertebral arteries to the skull base.  Tortuous proximal right CCA appears within normal limits. Negative right carotid bifurcation and cervical right ICA.  Tortuous proximal left CCA appears within normal limits. At the left carotid bifurcation there is irregularity and stenosis of the proximal left ICA, numerically estimated at 60-65 % with respect to the distal vessel (series 4, image 62). Distal to the origin the left ICA appears normal to the skull base.  Both vertebral arteries are patent in the neck. Grossly normal bilateral vertebral artery origins with tortuous bilateral V1 segments. Codominant V2 segments without stenosis. The V3 segments appear within normal limits.  MRA HEAD FINDINGS  No intracranial mass effect or ventriculomegaly.  Antegrade flow in the  distal vertebral arteries with patent PICA origins and no distal vertebral stenosis. The left V4 segment is mildly dominant. Normal vertebrobasilar junction and basilar artery. Normal SCA and right PCA origins. Fetal type left PCA origin. Right posterior communicating artery diminutive or absent. Bilateral PCA branches are within normal limits.  Symmetric antegrade flow in both ICA siphons. No siphon stenosis. Normal ophthalmic and left posterior communicating artery origins. Patent carotid termini. Normal MCA and ACA origins. Anterior communicating artery and visible ACA branches are within normal limits. Left MCA M1 segment and trifurcation are patent without stenosis. Right MCA M1 segment and bifurcation are patent without stenosis. Visible bilateral MCA branches are within normal limits.  IMPRESSION: 1. Left carotid bifurcation atherosclerosis with 60-65 % Left ICA origin stenosis. 2. Otherwise negative neck MRA; tortuous proximal great vessels and vertebral arteries. 3. Negative intracranial MRA.  Left Carotid Findings: Summary: Carotid Arterial Duplex Study Indications: CVA. Comparison Study: MRA 02/05/2019 Performing Technologist: [name], RVT Examination Guidelines: A complete evaluation includes B-mode imaging, spectral Doppler, color Doppler, and power Doppler as needed of all accessible portions of each vessel. Bilateral testing  is considered an integral part of a complete examination. Limited examinations for reoccurring indications may be performed as noted. PSV cm/s EDV cm/s Stenosis Plaque Description Comments CCA Prox 200 29 intimal thickening CCA Mid intimal thickening CCA Distal 94 14 intimal thickening ICA Prox 137 19 1-39% hyperechoic, calcific and focal ICA Distal 74 16 ECA 127 17 PSV cm/s EDV cms Describe Arm Pressure (mmHG) Subclavian 174 Multiphasic, WNL Vertebral PSV cm/s 38 EDV cm/s 9 Antegrade PSV cm/s EDV cm/s Stenosis  Plaque Description Comments CCA Prox 107 3 intimal thickening CCA Mid intimal thickening CCA Distal 98 12 intimal thickening ICA Prox 195 33 40-59% diffuse, heterogenous and calcific ICA Mid 183 29 calcific and heterogenous ICA Distal 73 15 ECA 172 38 focal, calcific and heterogenous PSV cm/s EDV cm/s Describe Arm Pressure (mmHG) Subclavian 282 Multiphasic, WNL Vertebral PSV cm/s 41 EDV cm/s 9 Antegrade Right Carotid: Velocities in the right ICA are consistent with a 1-39% stenosis. Final KELECHI HOEHN [id number] 02/07/2019 *See table(s) above for measurements and observations. Electronically signed by Antony Contras MD on 02/07/2019 at 2:17:24 PM. Left Carotid: Velocities in the left ICA are consistent with a 40-59% stenosis. Vertebrals: Bilateral vertebral arteries demonstrate antegrade flow. Subclavians: Normal flow hemodynamics were seen in bilateral subclavian arteries.  FINDINGS  Left Ventricle: Left ventricular ejection fraction, by visual estimation, is 50 to 55%. The left ventricle has normal function. No evidence of left ventricular regional wall motion abnormalities. There is borderline left ventricular hypertrophy.  Spectral Doppler shows Left ventricular diastolic parameters were normal pattern of LV diastolic filling. Technically difficult study. Parasternal views limited, cannot clearly assess wall thickness. No gross wall motion abnormalities but cannot exclude  small focal abnormalities.  Right Ventricle: The right ventricular size is not well visualized. Right vetricular wall thickness was not assessed. Global RV systolic function is was not well visualized. The tricuspid regurgitant velocity is 2.45 m/s, and with an assumed right atrial  pressure of 10 mmHg, the estimated right ventricular systolic pressure is mildly elevated at 34.0 mmHg.  Left Atrium: Left atrial size was normal in size.  Right Atrium: Right atrial size was mild-moderately dilated   Pericardium: The pericardium was not well visualized.  Mitral Valve: The mitral valve is grossly normal. Trace mitral valve regurgitation.  Tricuspid Valve: The tricuspid valve is grossly normal. Tricuspid valve regurgitation is trivial by color flow Doppler.  Aortic Valve: The aortic valve has an indeterminant number of cusps. There is Moderate thickening and Moderate calcification of the aortic valve. Aortic valve regurgitation is mild to moderate by color flow Doppler. Moderate aortic stenosis is present.  There is Moderate thickening of the aortic valve. Moderate calcification. Aortic valve mean gradient measures 24.0 mmHg. Aortic valve peak gradient measures 31.0 mmHg. Aortic valve area, by VTI measures 2.52 cm. Cannot exclude bicuspid aortic valve,  difficult image window. Moderate AS and mild-moderate AR.  Pulmonic Valve: The pulmonic valve was grossly normal. Pulmonic valve regurgitation is not visualized by color flow Doppler.  Aorta: Aortic dilatation noted. There is moderate dilatation of the ascending aorta measuring 42 mm.  Pulmonary Artery: The pulmonary artery is not well seen.  Venous: The inferior vena cava was not well visualized.  IAS/Shunts: No atrial level shunt detected by color flow Doppler.     LEFT VENTRICLE PLAX 2D LVIDd:         4.10 cm  Diastology LVIDs:         3.00 cm  LV e' lateral:   14.35 cm/s  LV PW:         1.60 cm  LV E/e' lateral: 9.1 LV IVS:        1.40 cm  LV e' medial:    10.10 cm/s LVOT diam:     2.40 cm  LV E/e' medial:  12.9 LV SV:         39 ml LV SV Index:   15.61 LVOT Area:     4.52 cm    RIGHT VENTRICLE RV S prime:     13.80 cm/s TAPSE (M-mode): 3.0 cm  LEFT ATRIUM             Index       RIGHT ATRIUM           Index LA diam:        4.70 cm 1.97 cm/m  RA Area:     24.50 cm LA Vol (A2C):   51.5 ml 21.64 ml/m RA Volume:   76.00 ml  31.93 ml/m LA Vol (A4C):   61.7 ml 25.93 ml/m LA Biplane Vol: 56.3 ml 23.66 ml/m   AORTIC VALVE AV Area (Vmax):    2.32 cm AV Area (Vmean):   2.47 cm AV Area (VTI):     2.52 cm AV Vmax:           278.50 cm/s AV Vmean:          194.000 cm/s AV VTI:            0.628 m AV Peak Grad:      31.0 mmHg AV Mean Grad:      24.0 mmHg LVOT Vmax:         143.00 cm/s LVOT Vmean:        106.000 cm/s LVOT VTI:          0.350 m LVOT/AV VTI ratio: 0.56   AORTA Ao Root diam: 3.90 cm  MITRAL VALVE                         TRICUSPID VALVE MV Area (PHT): 2.56 cm              TR Peak grad:   24.0 mmHg MV PHT:        85.84 msec            TR Vmax:        245.00 cm/s MV Decel Time: 296 msec MV E velocity: 130.00 cm/s 103 cm/s  SHUNTS MV A velocity: 104.00 cm/s 70.3 cm/s Systemic VTI:  0.35 m MV E/A ratio:  1.25        1.5       Systemic Diam: 2.40 cm   Discharge Instructions: Discharge Instructions    Diet - low sodium heart healthy   Complete by: As directed    Increase activity slowly   Complete by: As directed       Signed: Maudie Mercury, MD 02/07/2019, 3:42 PM   Pager: [phone]

## 2019-02-07 NOTE — Progress Notes (Signed)
   Subjective:  [name] was seen at bedside this morning. He states that he is doing well. He endorses urinating 3 times last night. I asked him to remind his nurse when he urinates so we can get accurate in and outs to monitor for possible urinary retention. He voiced understanding. We spoke about his upcoming US carotid, which is scheduled for 2:00 pm today. We also stated that Vascular is awaiting the results of his ultrasound before he can be discharged, but we will continue to monitor him. He was amendable to the plan. All questions and concerns were addressed.    Objective:  Vital signs in last 24 hours: Vitals:   02/07/19 0033 02/07/19 0435 02/07/19 0500 02/07/19 0801  BP: (!) 133/47 (!) 126/52  (!) 137/59  Pulse: (!) 56 (!) 55  (!) 54  Resp: 18 17  18   Temp: 98.8 F (37.1 C) 97.6 F (36.4 C)  98.5 F (36.9 C)  TempSrc: Oral Oral  Oral  SpO2: 97% 98%  100%  Weight:   119.4 kg    Physical Exam Vitals signs and nursing note reviewed.  Constitutional:      General: He is not in acute distress.    Appearance: Normal appearance. He is not ill-appearing, toxic-appearing or diaphoretic.  HENT:     Head: Normocephalic and atraumatic.  Cardiovascular:     Rate and Rhythm: Normal rate and regular rhythm.     Pulses: Normal pulses.     Comments: Grade III holosystolic murmur appreciated at all cardiac post.  Pulmonary:     Effort: Pulmonary effort is normal.     Breath sounds: Normal breath sounds. No wheezing, rhonchi or rales.  Abdominal:     General: Abdomen is flat. Bowel sounds are normal.     Tenderness: There is no abdominal tenderness. There is no guarding.  Neurological:     Mental Status: He is alert and oriented to person, place, and time.  Psychiatric:        Mood and Affect: Mood normal.        Behavior: Behavior normal.      Assessment/Plan:  Active Problems:   Stroke (HCC)  Plan:   Anterolateral Left Thalamic Stroke:  - Continue Plavix 75 mg QD -  Continue  ASA 81 mg QD - Ezetimibe 10 mg - Pravastatin 80 mg - Neurology is consulting this patient. We appreciate their assistance with Allen Schultz care.              - TTE: In process             - Permissive HTN 220/120             - Lipid panel: T: 131 HDL: 41 LDL: 78             - HgA1c: 6.1 - ContinueTelemetry  - Fall precautions  - Vascular following patient, they will make recommendations based on results of US carotid. - Ultrasound carotid scheduled for 1400.    Liver Transplant:  - CMP: Stable from yesterday - Continue Mycophenolate - Maintenance fluid 100 mL/hr - Please Maintain I/Os  Dispo: Anticipated discharge pending medical course.   Allen Mercury, MD 02/07/2019, 10:17 AM Pager: [phone]

## 2019-02-07 NOTE — TOC Transition Note (Signed)
Transition of Care [facility]) - CM/SW Discharge Note   Patient Details  Name: Allen Schultz MRN: [id number] Date of Birth: [birth year]  Transition of Care [facility]) CM/SW Contact:  Pollie Friar, RN Phone Number: 02/07/2019, 4:01 PM   Clinical Narrative:    Pt discharging home with self care. No f/u per PT/OT and no DME needs.  Pt has transportation home.   Final next level of care: Home/Self Care Barriers to Discharge: No Barriers Identified   Patient Goals and CMS Choice        Discharge Placement                       Discharge Plan and Services                                     Social Determinants of Health (SDOH) Interventions     Readmission Risk Interventions No flowsheet data found.

## 2019-02-07 NOTE — Progress Notes (Signed)
Patient is discharging home. Brother called for transport home. All discharge instructions went over with patient. All questions and concerns addressed. IV taken out. All belongings to be sent with patient. Patient will be taken down in wheelchair when brother arrives. [place]

## 2019-02-07 NOTE — Progress Notes (Signed)
  Date: 02/07/2019  Patient name: Allen Schultz  Medical record number: [id number]  Date of birth: 23-Sep-1950        I have seen and evaluated this patient and I have discussed the plan of care with the house staff. Please see their note for complete details. I concur with their findings with the following additions/corrections: [name] was seen this morning on team rounds.  His carotid Doppler showed left 40 to 59% stenosis.  Therefore, he might be a candidate for outpatient elective CEA but the final determination will come from vascular.  He is stable to be discharged after vascular has finalized their inpatient work-up and recommendations.  Bartholomew Crews, MD 02/07/2019, 3:22 PM

## 2019-02-07 NOTE — Progress Notes (Signed)
STROKE TEAM PROGRESS NOTE   INTERVAL HISTORY Patient is doing well.  He still has some mild speech difficulties but has had no new complaints.  Carotid ultrasound was done this morning and shows heterogeneous plaque at left carotid bifurcation with 40-59% stenosis.  Dr. Donnetta Hutching from vascular surgery has seen him and plans to operate early next week. 2D echo is pending Vitals:   02/07/19 0435 02/07/19 0500 02/07/19 0801 02/07/19 1202  BP: (!) 126/52  (!) 137/59 132/60  Pulse: (!) 55  (!) 54 (!) 57  Resp: 17  18 18   Temp: 97.6 F (36.4 C)  98.5 F (36.9 C) 99.6 F (37.6 C)  TempSrc: Oral  Oral Oral  SpO2: 98%  100% 97%  Weight:  119.4 kg      CBC:  Recent Labs  Lab 02/06/19 1529 02/07/19 0639  WBC 5.1 4.9  HGB 11.0* 11.7*  HCT 32.1* 33.7*  MCV 93.0 93.4  PLT 80* 79*    Basic Metabolic Panel:  Recent Labs  Lab 02/06/19 0505 02/07/19 0313  NA 138 137  K 3.8 3.8  CL 111 107  CO2 21* 21*  GLUCOSE 97 101*  BUN 16 15  CREATININE 1.13 1.14  CALCIUM 8.5* 8.4*   Lipid Panel:     Component Value Date/Time   CHOL 131 02/06/2019 0505   TRIG 59 02/06/2019 0505   HDL 41 02/06/2019 0505   CHOLHDL 3.2 02/06/2019 0505   VLDL 12 02/06/2019 0505   LDLCALC 78 02/06/2019 0505   HgbA1c:  Lab Results  Component Value Date   HGBA1C 6.1 (H) 02/06/2019   Urine Drug Screen: No results found for: LABOPIA, COCAINSCRNUR, LABBENZ, AMPHETMU, THCU, LABBARB  Alcohol Level     Component Value Date/Time   [facility] <10 02/05/2019 1940    IMAGING Mr Angio Head Wo Contrast  Result Date: 02/05/2019 CLINICAL DATA:  68 year old male with acute left thalamic infarct on MRI earlier today. EXAM: MRA HEAD WITHOUT CONTRAST MRA NECK WITHOUT CONTRAST TECHNIQUE: Angiographic images of the Circle of Willis were obtained using MRA technique without intravenous contrast. Angiographic images of the neck were obtained using MRA technique without intravenous contrast. Carotid stenosis measurements (when  applicable) are obtained utilizing NASCET criteria, using the distal internal carotid diameter as the denominator. COMPARISON:  Brain MRI earlier today. FINDINGS: MRA NECK FINDINGS Time-of-flight images demonstrate tortuous proximal great vessels, antegrade flow in the bilateral cervical carotid and vertebral arteries to the skull base. Tortuous proximal right CCA appears within normal limits. Negative right carotid bifurcation and cervical right ICA. Tortuous proximal left CCA appears within normal limits. At the left carotid bifurcation there is irregularity and stenosis of the proximal left ICA, numerically estimated at 60-65 % with respect to the distal vessel (series 4, image 62). Distal to the origin the left ICA appears normal to the skull base. Both vertebral arteries are patent in the neck. Grossly normal bilateral vertebral artery origins with tortuous bilateral V1 segments. Codominant V2 segments without stenosis. The V3 segments appear within normal limits. MRA HEAD FINDINGS No intracranial mass effect or ventriculomegaly. Antegrade flow in the distal vertebral arteries with patent PICA origins and no distal vertebral stenosis. The left V4 segment is mildly dominant. Normal vertebrobasilar junction and basilar artery. Normal SCA and right PCA origins. Fetal type left PCA origin. Right posterior communicating artery diminutive or absent. Bilateral PCA branches are within normal limits. Symmetric antegrade flow in both ICA siphons. No siphon stenosis. Normal ophthalmic and left posterior communicating artery  origins. Patent carotid termini. Normal MCA and ACA origins. Anterior communicating artery and visible ACA branches are within normal limits. Left MCA M1 segment and trifurcation are patent without stenosis. Right MCA M1 segment and bifurcation are patent without stenosis. Visible bilateral MCA branches are within normal limits. IMPRESSION: 1. Left carotid bifurcation atherosclerosis with 60-65 % Left  ICA origin stenosis. 2. Otherwise negative neck MRA; tortuous proximal great vessels and vertebral arteries. 3. Negative intracranial MRA. Electronically Signed   By: Genevie Ann M.D.   On: 02/05/2019 22:57   Mr Angio Neck Wo Contrast  Result Date: 02/05/2019 CLINICAL DATA:  68 year old male with acute left thalamic infarct on MRI earlier today. EXAM: MRA HEAD WITHOUT CONTRAST MRA NECK WITHOUT CONTRAST TECHNIQUE: Angiographic images of the Circle of Willis were obtained using MRA technique without intravenous contrast. Angiographic images of the neck were obtained using MRA technique without intravenous contrast. Carotid stenosis measurements (when applicable) are obtained utilizing NASCET criteria, using the distal internal carotid diameter as the denominator. COMPARISON:  Brain MRI earlier today. FINDINGS: MRA NECK FINDINGS Time-of-flight images demonstrate tortuous proximal great vessels, antegrade flow in the bilateral cervical carotid and vertebral arteries to the skull base. Tortuous proximal right CCA appears within normal limits. Negative right carotid bifurcation and cervical right ICA. Tortuous proximal left CCA appears within normal limits. At the left carotid bifurcation there is irregularity and stenosis of the proximal left ICA, numerically estimated at 60-65 % with respect to the distal vessel (series 4, image 62). Distal to the origin the left ICA appears normal to the skull base. Both vertebral arteries are patent in the neck. Grossly normal bilateral vertebral artery origins with tortuous bilateral V1 segments. Codominant V2 segments without stenosis. The V3 segments appear within normal limits. MRA HEAD FINDINGS No intracranial mass effect or ventriculomegaly. Antegrade flow in the distal vertebral arteries with patent PICA origins and no distal vertebral stenosis. The left V4 segment is mildly dominant. Normal vertebrobasilar junction and basilar artery. Normal SCA and right PCA origins. Fetal  type left PCA origin. Right posterior communicating artery diminutive or absent. Bilateral PCA branches are within normal limits. Symmetric antegrade flow in both ICA siphons. No siphon stenosis. Normal ophthalmic and left posterior communicating artery origins. Patent carotid termini. Normal MCA and ACA origins. Anterior communicating artery and visible ACA branches are within normal limits. Left MCA M1 segment and trifurcation are patent without stenosis. Right MCA M1 segment and bifurcation are patent without stenosis. Visible bilateral MCA branches are within normal limits. IMPRESSION: 1. Left carotid bifurcation atherosclerosis with 60-65 % Left ICA origin stenosis. 2. Otherwise negative neck MRA; tortuous proximal great vessels and vertebral arteries. 3. Negative intracranial MRA. Electronically Signed   By: Genevie Ann M.D.   On: 02/05/2019 22:57   Mr Brain Wo Contrast  Result Date: 02/05/2019 CLINICAL DATA:  Unexplained altered level of consciousness. EXAM: MRI HEAD WITHOUT CONTRAST TECHNIQUE: Multiplanar, multiecho pulse sequences of the brain and surrounding structures were obtained without intravenous contrast. COMPARISON:  Head CT earlier same day. FINDINGS: Brain: Diffusion imaging shows an acute 2 cm infarction affecting the anterior and lateral left thalamus. No other acute infarction. Mild chronic small-vessel changes affect the pons. No focal cerebellar insult. Cerebral hemispheres elsewhere show age related volume loss with minimal small vessel change of the white matter. No mass, hemorrhage, hydrocephalus or extra-axial collection. Vascular: Major vessels at the base of the brain show flow. Skull and upper cervical spine: Negative Sinuses/Orbits: Clear/normal Other: None IMPRESSION: Acute  2 cm infarction affecting the anterolateral left thalamus. No evidence of hemorrhage or mass effect. Electronically Signed   By: Nelson Chimes M.D.   On: 02/05/2019 16:55   Dg Chest Port 1 View  Result Date:  02/05/2019 CLINICAL DATA:  68 year old male with concern for CVA. EXAM: PORTABLE CHEST 1 VIEW COMPARISON:  Chest radiograph dated 10/14/2016 FINDINGS: Shallow inspiration. No focal consolidation, pleural effusion, pneumothorax. Mild diffuse interstitial coarsening. Stable cardiac silhouette. No acute osseous pathology. IMPRESSION: No active disease. No interval change. Electronically Signed   By: Anner Crete M.D.   On: 02/05/2019 20:28   Vas US Carotid  Result Date: 02/07/2019 Carotid Arterial Duplex Study Indications:       CVA. Comparison Study:  MRA 02/05/2019 Performing Technologist: Heppner, RVT  Examination Guidelines: A complete evaluation includes B-mode imaging, spectral Doppler, color Doppler, and power Doppler as needed of all accessible portions of each vessel. Bilateral testing is considered an integral part of a complete examination. Limited examinations for reoccurring indications may be performed as noted.  Right Carotid Findings: +----------+--------+--------+--------+---------------------+------------------+           PSV cm/sEDV cm/sStenosisPlaque Description   Comments           +----------+--------+--------+--------+---------------------+------------------+ CCA Prox  200     29                                   intimal thickening +----------+--------+--------+--------+---------------------+------------------+ CCA Mid                                                intimal thickening +----------+--------+--------+--------+---------------------+------------------+ CCA Distal94      14                                   intimal thickening +----------+--------+--------+--------+---------------------+------------------+ ICA Prox  137     19      1-39%   hyperechoic, calcific                                                     and focal                                +----------+--------+--------+--------+---------------------+------------------+ ICA Distal74      16                                                      +----------+--------+--------+--------+---------------------+------------------+ ECA       127     17                                                      +----------+--------+--------+--------+---------------------+------------------+ +----------+--------+-------+----------------+-------------------+  PSV cm/sEDV cmsDescribe        Arm Pressure (mmHG) +----------+--------+-------+----------------+-------------------+ [id number]            Multiphasic, WNL                    +----------+--------+-------+----------------+-------------------+ +---------+--------+--+--------+-+---------+ VertebralPSV cm/s38EDV cm/s9Antegrade +---------+--------+--+--------+-+---------+  Left Carotid Findings: +----------+--------+--------+--------+---------------------+------------------+           PSV cm/sEDV cm/sStenosisPlaque Description   Comments           +----------+--------+--------+--------+---------------------+------------------+ CCA Prox  107     3                                    intimal thickening +----------+--------+--------+--------+---------------------+------------------+ CCA Mid                                                intimal thickening +----------+--------+--------+--------+---------------------+------------------+ CCA Distal98      12                                   intimal thickening +----------+--------+--------+--------+---------------------+------------------+ ICA Prox  195     33      40-59%  diffuse, heterogenous                                                     and calcific                            +----------+--------+--------+--------+---------------------+------------------+ ICA Mid   183     29              calcific and                                                               heterogenous                            +----------+--------+--------+--------+---------------------+------------------+ ICA Distal73      15                                                      +----------+--------+--------+--------+---------------------+------------------+ ECA       172     38              focal, calcific and                                                       heterogenous                            +----------+--------+--------+--------+---------------------+------------------+ +----------+--------+--------+----------------+-------------------+  PSV cm/sEDV cm/sDescribe        Arm Pressure (mmHG) +----------+--------+--------+----------------+-------------------+ [id number]             Multiphasic, WNL                    +----------+--------+--------+----------------+-------------------+ +---------+--------+--+--------+-+---------+ VertebralPSV cm/s41EDV cm/s9Antegrade +---------+--------+--+--------+-+---------+  Summary: Right Carotid: Velocities in the right ICA are consistent with a 1-39% stenosis. Left Carotid: Velocities in the left ICA are consistent with a 40-59% stenosis. Vertebrals:  Bilateral vertebral arteries demonstrate antegrade flow. Subclavians: Normal flow hemodynamics were seen in bilateral subclavian              arteries. *See table(s) above for measurements and observations.  Electronically signed by Antony Contras MD on 02/07/2019 at 2:17:24 PM.    Final     PHYSICAL EXAM Pleasant middle-aged Caucasian male not in distress. . Afebrile. Head is nontraumatic. Neck is supple without bruit.    Cardiac exam show harsh ejection systolic murmur heard throughout precordium.. Lungs are clear to auscultation. Distal pulses are well felt. Neurological Exam ;  Awake  Alert oriented x 3. Normal speech and language.eye movements full without nystagmus.fundi were not visualized. Vision  acuity and fields appear normal. Hearing is normal. Palatal movements are normal. Face symmetric. Tongue midline. Normal strength, tone, reflexes and coordination. Normal sensation. Gait deferred.  ASSESSMENT/PLAN [name] is a 68 y.o. male with history of neuropathy, liver transplant, hypertension, hypercholesterolemia, diabetes, heart murmur and anxiety presenting with word finding difficulties.   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  Carotid Doppler heterogeneous plaque at left carotid bifurcation with 40-59% stenosis.  1-39% right ICA stenosis   2D Echo ejection fraction 50-55%.  Normal LV function.  Borderline left ventricular hypertrophy.  Mild calcification of aortic valve.  LDL 78  HgbA1c 6.1  Heparin 5000 units sq tid for VTE prophylaxis  No antithrombotic prior to admission, now on aspirin 81 mg daily and clopidogrel 75 mg daily. Continue.   Therapy recommendations:  No PT  Disposition:  Return home  Carotid Stenosis, L  MRA neck L ICA bifurcation w/ 60-65% stenosis.  Carotid Doppler  pending   VVS consult  Ok for surgery from stroke standpoint  Hypertension  Stable . Permissive hypertension (OK if < 220/120) with Goal 130-150 given ICA stenosis . Long-term BP goal normotensive  Hyperlipidemia  Home meds:  zetia 10  Intolerant to statins  LDL 78, goal < 70  Continue statin at discharge  Diabetes type II Controlled  HgbA1c 6.1, goal < 7.0  Other Stroke Risk Factors  Advanced age  Former Cigarette smoker  Obesity, Body mass index is 37.24 kg/m., recommend weight loss, diet and exercise as appropriate   Other Active Problems  Hx liver transplant, on mycophenolate.  Hospital day # 2     He presented with transient speech and language difficulties due to large left thalamic infarct likely from thromboembolism from moderate proximal left carotid stenosis with unstable plaque with persistent fetal pattern of origin of the left posterior cerebral artery.  Recommend aspirin and Plavix and early left carotid revascularization next week..  Discussed with Dr. Donnetta Hutching vascular surgeon.  Greater than 50% time during this 25-minute visit was spent on counseling  and coordination of care about his stroke and discussion about treatment for carotid stenosis and answering questions.  Discussed with Dr. Lovena Neighbours and Early.  Stroke team will sign off.  Kindly call for questions.  Follow-up with outpatient stroke clinic in 6 weeks. Antony Contras, MD Medical Director [facility] Pager: [phone] 02/07/2019 2:50 PM   To contact Stroke Continuity provider, please refer to [email or web address] After hours, contact General Neurology

## 2019-02-07 NOTE — Progress Notes (Signed)
Carotid Duplex Complete. Please see CV Proc tab for preliminary results. Lita Mains- RDMS, RVT 2:16 PM  02/07/2019

## 2019-02-08 ENCOUNTER — Other Ambulatory Visit: Payer: Self-pay | Admitting: *Deleted

## 2019-02-08 ENCOUNTER — Encounter: Payer: Self-pay | Admitting: *Deleted

## 2019-02-08 ENCOUNTER — Telehealth: Payer: Self-pay | Admitting: *Deleted

## 2019-02-08 DIAGNOSIS — Z944 Liver transplant status: Principal | ICD-10-CM

## 2019-02-08 DIAGNOSIS — Z79899 Other long term (current) drug therapy: Principal | ICD-10-CM

## 2019-02-08 LAB — PATHOLOGIST SMEAR REVIEW

## 2019-02-08 NOTE — Telephone Encounter (Signed)
Message left on home phone and cell phone for patient to call this office today to schedule surgery.

## 2019-02-08 NOTE — Progress Notes (Signed)
Spoke with brother BRIT. [facility] POA. Reviewed all instructions per pre-op instruction letter. Faxed all information to brother and mailed information to patient's house as well. To continue asa and Plavix to surgery. Verbalized understanding and to call this office if questions.

## 2019-02-12 DIAGNOSIS — E669 Obesity, unspecified: Secondary | ICD-10-CM | POA: Insufficient documentation

## 2019-02-12 DIAGNOSIS — I6523 Occlusion and stenosis of bilateral carotid arteries: Secondary | ICD-10-CM | POA: Insufficient documentation

## 2019-02-15 NOTE — Pre-Procedure Instructions (Signed)
[facility] [place], [place] AT [facility] [address] [place] [address] Phone: [phone] Fax: [phone]      Your procedure is scheduled on 02-20-19  Report to [facility] Main Entrance "A" at 0730 A.M., and check in at the Admitting office.  Call this number if you have problems the morning of surgery:  [phone]  Call [phone] if you have any questions prior to your surgery date Monday-Friday 8am-4pm    Remember:  Do not eat or drink after midnight the night before your surgery  Take these medicines the morning of surgery with A SIP OF WATER : Mycophenolate Nebivolol Flomax escitalopram (LEXAPRO) famotidine (PEPCID) gabapentin (NEURONTIN) cyclobenzaprine (FLEXERIL)as needed Tramadol as needed  Follow your surgeon's instructions on  Plavix and Aspirin.  If no instructions were given by your surgeon then you will need to call the office to get those instructions.    7 days prior to surgery STOP taking any Aspirin (unless otherwise instructed by your surgeon), Aleve, Naproxen, Ibuprofen, Motrin, Advil, Goody's, BC's, all herbal medications, fish oil, and all vitamins.    The Morning of Surgery  Do not wear jewelry, make-up or nail polish.  Do not wear lotions, powders, or perfumes/colognes, or deodorant  Do not shave 48 hours prior to surgery.  Men may shave face and neck.  Do not bring valuables to the hospital.  [facility] is not responsible for any belongings or valuables.  If you are a smoker, DO NOT Smoke 24 hours prior to surgery IF you wear a CPAP at night please bring your mask, tubing, and machine the morning of surgery   Remember that you must have someone to transport you home after your surgery, and remain with you for 24 hours if you are discharged the same day.   Contacts, glasses, hearing aids, dentures or bridgework may not be worn into surgery.    Leave your suitcase in the  car.  After surgery it may be brought to your room.  For patients admitted to the hospital, discharge time will be determined by your treatment team.  Patients discharged the day of surgery will not be allowed to drive home.    Special instructions:   [place]- Preparing For Surgery  Before surgery, you can play an important role. Because skin is not sterile, your skin needs to be as free of germs as possible. You can reduce the number of germs on your skin by washing with CHG (chlorahexidine gluconate) Soap before surgery.  CHG is an antiseptic cleaner which kills germs and bonds with the skin to continue killing germs even after washing.    Oral Hygiene is also important to reduce your risk of infection.  Remember - BRUSH YOUR TEETH THE MORNING OF SURGERY WITH YOUR REGULAR TOOTHPASTE  Please do not use if you have an allergy to CHG or antibacterial soaps. If your skin becomes reddened/irritated stop using the CHG.  Do not shave (including legs and underarms) for at least 48 hours prior to first CHG shower. It is OK to shave your face.  Please follow these instructions carefully.   1. Shower the NIGHT BEFORE SURGERY and the MORNING OF SURGERY with CHG Soap.   2. If you chose to wash your hair, wash your hair first as usual with your normal shampoo.  3. After you shampoo, rinse your hair and body thoroughly to remove the shampoo.  4. Use CHG as you would any other liquid soap. You can apply CHG directly to the skin and wash gently with a scrungie or a clean washcloth.   5. Apply the CHG Soap to your body ONLY FROM THE NECK DOWN.  Do not use on open wounds or open sores. Avoid contact with your eyes, ears, mouth and genitals (private parts). Wash Face and genitals (private parts)  with your normal soap.   6. Wash thoroughly, paying special attention to the area where your surgery will be performed.  7. Thoroughly rinse your body with warm water from the neck down.  8. DO NOT  shower/wash with your normal soap after using and rinsing off the CHG Soap.  9. Pat yourself dry with a CLEAN TOWEL.  10. Wear CLEAN PAJAMAS to bed the night before surgery, wear comfortable clothes the morning of surgery  11. Place CLEAN SHEETS on your bed the night of your first shower and DO NOT SLEEP WITH PETS.    Day of Surgery:  Do not apply any deodorants/lotions. Please shower the morning of surgery with the CHG soap  Please wear clean clothes to the hospital/surgery center.   Remember to brush your teeth WITH YOUR REGULAR TOOTHPASTE.   Please read over the fact sheets that you were given.

## 2019-02-18 ENCOUNTER — Other Ambulatory Visit: Payer: Self-pay

## 2019-02-18 ENCOUNTER — Other Ambulatory Visit: Payer: Self-pay | Admitting: *Deleted

## 2019-02-18 ENCOUNTER — Encounter (HOSPITAL_COMMUNITY)
Admission: RE | Admit: 2019-02-18 | Discharge: 2019-02-18 | Disposition: A | Discharge status: Home / Self Care | Payer: Medicare Other | Source: Ambulatory Visit | Attending: Vascular Surgery | Admitting: Vascular Surgery

## 2019-02-18 ENCOUNTER — Encounter: Payer: Self-pay | Admitting: *Deleted

## 2019-02-18 ENCOUNTER — Other Ambulatory Visit (HOSPITAL_COMMUNITY)
Admission: RE | Admit: 2019-02-18 | Discharge: 2019-02-18 | Disposition: A | Discharge status: Home / Self Care | Payer: Medicare Other | Source: Ambulatory Visit | Attending: Vascular Surgery | Admitting: Vascular Surgery

## 2019-02-18 ENCOUNTER — Encounter (HOSPITAL_COMMUNITY): Payer: Self-pay

## 2019-02-18 DIAGNOSIS — Z944 Liver transplant status: Principal | ICD-10-CM

## 2019-02-18 DIAGNOSIS — Z79899 Other long term (current) drug therapy: Principal | ICD-10-CM

## 2019-02-18 DIAGNOSIS — N189 Chronic kidney disease, unspecified: Secondary | ICD-10-CM | POA: Insufficient documentation

## 2019-02-18 DIAGNOSIS — I6522 Occlusion and stenosis of left carotid artery: Secondary | ICD-10-CM | POA: Insufficient documentation

## 2019-02-18 DIAGNOSIS — M2578 Osteophyte, vertebrae: Secondary | ICD-10-CM | POA: Insufficient documentation

## 2019-02-18 DIAGNOSIS — F419 Anxiety disorder, unspecified: Secondary | ICD-10-CM | POA: Insufficient documentation

## 2019-02-18 DIAGNOSIS — F329 Major depressive disorder, single episode, unspecified: Secondary | ICD-10-CM | POA: Insufficient documentation

## 2019-02-18 DIAGNOSIS — Z794 Long term (current) use of insulin: Secondary | ICD-10-CM | POA: Insufficient documentation

## 2019-02-18 DIAGNOSIS — Z01812 Encounter for preprocedural laboratory examination: Secondary | ICD-10-CM | POA: Insufficient documentation

## 2019-02-18 DIAGNOSIS — K219 Gastro-esophageal reflux disease without esophagitis: Secondary | ICD-10-CM | POA: Insufficient documentation

## 2019-02-18 DIAGNOSIS — I129 Hypertensive chronic kidney disease with stage 1 through stage 4 chronic kidney disease, or unspecified chronic kidney disease: Secondary | ICD-10-CM | POA: Insufficient documentation

## 2019-02-18 DIAGNOSIS — E7800 Pure hypercholesterolemia, unspecified: Secondary | ICD-10-CM | POA: Insufficient documentation

## 2019-02-18 DIAGNOSIS — Z87891 Personal history of nicotine dependence: Secondary | ICD-10-CM | POA: Insufficient documentation

## 2019-02-18 DIAGNOSIS — M5021 Other cervical disc displacement,  high cervical region: Secondary | ICD-10-CM | POA: Insufficient documentation

## 2019-02-18 DIAGNOSIS — Z7982 Long term (current) use of aspirin: Secondary | ICD-10-CM | POA: Insufficient documentation

## 2019-02-18 DIAGNOSIS — Z7902 Long term (current) use of antithrombotics/antiplatelets: Secondary | ICD-10-CM | POA: Insufficient documentation

## 2019-02-18 DIAGNOSIS — M4802 Spinal stenosis, cervical region: Secondary | ICD-10-CM | POA: Insufficient documentation

## 2019-02-18 DIAGNOSIS — I491 Atrial premature depolarization: Secondary | ICD-10-CM | POA: Insufficient documentation

## 2019-02-18 DIAGNOSIS — E1122 Type 2 diabetes mellitus with diabetic chronic kidney disease: Secondary | ICD-10-CM | POA: Insufficient documentation

## 2019-02-18 HISTORY — DX: Malignant neoplasm of intestinal tract, part unspecified: C26.0

## 2019-02-18 HISTORY — DX: Chronic kidney disease, unspecified: N18.9

## 2019-02-18 HISTORY — DX: Other specified postprocedural states: Z98.890

## 2019-02-18 HISTORY — DX: Other specified postprocedural states: R11.2

## 2019-02-18 HISTORY — DX: Nonrheumatic aortic (valve) stenosis: I35.0

## 2019-02-18 LAB — URINALYSIS, ROUTINE W REFLEX MICROSCOPIC
Bilirubin Urine: NEGATIVE
Glucose, UA: NEGATIVE mg/dL
Ketones, ur: NEGATIVE mg/dL
Leukocytes,Ua: NEGATIVE
Nitrite: NEGATIVE
Protein, ur: NEGATIVE mg/dL
Specific Gravity, Urine: >1.03 — ABNORMAL HIGH (ref 1.005–1.030)
pH: 5.5 (ref 5.0–8.0)

## 2019-02-18 LAB — COMPREHENSIVE METABOLIC PANEL
ALT: 44 U/L (ref 0–44)
AST: 81 U/L — ABNORMAL HIGH (ref 15–41)
Albumin: 2.6 g/dL — ABNORMAL LOW (ref 3.5–5.0)
Alkaline Phosphatase: 201 U/L — ABNORMAL HIGH (ref 38–126)
Anion gap: 6 (ref 5–15)
BUN: 15 mg/dL (ref 8–23)
CO2: 24 mmol/L (ref 22–32)
Calcium: 9 mg/dL (ref 8.9–10.3)
Chloride: 106 mmol/L (ref 98–111)
Creatinine, Ser: 1.27 mg/dL — ABNORMAL HIGH (ref 0.61–1.24)
GFR calc Af Amer: >60 mL/min (ref >60)
GFR calc non Af Amer: 58 mL/min — ABNORMAL LOW (ref >60)
Glucose, Bld: 216 mg/dL — ABNORMAL HIGH (ref 70–99)
Potassium: 4.6 mmol/L (ref 3.5–5.1)
Sodium: 136 mmol/L (ref 135–145)
Total Bilirubin: 1.7 mg/dL — ABNORMAL HIGH (ref 0.3–1.2)
Total Protein: 6.5 g/dL (ref 6.5–8.1)

## 2019-02-18 LAB — PROTIME-INR
INR: 1.3 — ABNORMAL HIGH (ref 0.8–1.2)
Prothrombin Time: 15.7 seconds — ABNORMAL HIGH (ref 11.4–15.2)

## 2019-02-18 LAB — URINALYSIS, MICROSCOPIC (REFLEX)

## 2019-02-18 LAB — CBC
HCT: 38.2 % — ABNORMAL LOW (ref 39.0–52.0)
Hemoglobin: 12.4 g/dL — ABNORMAL LOW (ref 13.0–17.0)
MCH: 30.9 pg (ref 26.0–34.0)
MCHC: 32.5 g/dL (ref 30.0–36.0)
MCV: 95.3 fL (ref 80.0–100.0)
Platelets: 86 10*3/uL — ABNORMAL LOW (ref 150–400)
RBC: 4.01 MIL/uL — ABNORMAL LOW (ref 4.22–5.81)
RDW: 14.8 % (ref 11.5–15.5)
WBC: 5.8 10*3/uL (ref 4.0–10.5)
nRBC: 0 % (ref 0.0–0.2)

## 2019-02-18 LAB — ABO/RH: ABO/RH(D): A POS

## 2019-02-18 LAB — SURGICAL PCR SCREEN
MRSA, PCR: NEGATIVE
Staphylococcus aureus: NEGATIVE

## 2019-02-18 LAB — TYPE AND SCREEN
ABO/RH(D): A POS
Antibody Screen: NEGATIVE

## 2019-02-18 LAB — APTT: aPTT: 37 seconds — ABNORMAL HIGH (ref 24–36)

## 2019-02-18 LAB — SARS CORONAVIRUS 2 (TAT 6-24 HRS): SARS Coronavirus 2: NEGATIVE

## 2019-02-18 LAB — GLUCOSE, CAPILLARY: Glucose-Capillary: 197 mg/dL — ABNORMAL HIGH (ref 70–99)

## 2019-02-18 NOTE — Progress Notes (Signed)
Abnormal labs resulted, message sent to Dr. Donnetta Hutching via [place].

## 2019-02-18 NOTE — Progress Notes (Signed)
PCP - Dr. Blenda Mounts Cardiologist - denies  Chest x-ray - N/A EKG - 02/05/2019 Stress Test - denies ECHO - 02/06/2019 Cardiac Cath - denies  Sleep Study - denies CPAP - N/A  Fasting Blood Sugar - 100 - 151 Checks Blood Sugar 2 times a day  Blood Thinner Instructions: Plavix - per surgeons office, to be continued taking as prescribed Aspirin Instructions:  per surgeons office, to be continued taking as prescribed  ERAS Protcol - No PRE-SURGERY Ensure or G2- N/A  COVID TEST-  Scheduled for today 02/18/2019 after PAT appointment. Patient verbalized understanding of self-quarantine instructions, and appointment time and place.  Anesthesia review: YES, recent ED visit, hx of liver transplant Patient denies shortness of breath, fever, cough and chest pain at PAT appointment  All instructions explained to the patient, with a verbal understanding of the material. Patient agrees to go over the instructions while at home for a better understanding. Patient also instructed to self quarantine after being tested for COVID-19. The opportunity to ask questions was provided.

## 2019-02-18 NOTE — Pre-Procedure Instructions (Addendum)
[facility] [place], [place] AT [facility] [address] [place] [address] Phone: [phone] Fax: [phone]    Your procedure is scheduled on 02-20-19  Report to [facility] Main Entrance "A" at 0730 A.M., and check in at the Admitting office.  Call this number if you have problems the morning of surgery:  [phone]  Call [phone] if you have any questions prior to your surgery date Monday-Friday 8am-4pm   Remember:  Do not eat or drink after midnight the night before your surgery  Take these medicines the morning of surgery with A SIP OF WATER : escitalopram (LEXAPRO)  famotidine (PEPCID)  gabapentin (NEURONTIN)  mycophenolate (MYFORTIC)  nebivolol (BYSTOLIC)  tamsulosin (FLOMAX)   If needed - cyclobenzaprine (FLEXERIL), Tramadol   Follow your surgeon's instructions on  Plavix and Aspirin.  If no instructions were given by your surgeon then you will need to call the office to get those instructions.    As of today, STOP taking any Aspirin (unless otherwise instructed by your surgeon), Aleve, Naproxen, Ibuprofen, Motrin, Advil, Goody's, BC's, all herbal medications, fish oil, and all vitamins.   How to Manage Your Diabetes Before and After Surgery  WHAT DO I DO ABOUT MY DIABETES MEDICATION?  . THE NIGHT BEFORE SURGERY, take 10 - 17 units of insulin lispro protamine-lispro (HUMALOG 75/25 MIX) insulin. (only if you normally take itat dinner time/ PM dose)     . DO NOT TAKE insulin lispro protamine-lispro (HUMALOG 75/25 MIX) the morning of surgery  Why is it important to control my blood sugar before and after surgery? . Improving blood sugar levels before and after surgery helps healing and can limit problems. . A way of improving blood sugar control is eating a healthy diet by: o  Eating less sugar and carbohydrates o  Increasing activity/exercise o  Talking with your doctor about reaching your blood  sugar goals . High blood sugars (greater than 180 mg/dL) can raise your risk of infections and slow your recovery, so you will need to focus on controlling your diabetes during the weeks before surgery. . Make sure that the doctor who takes care of your diabetes knows about your planned surgery including the date and location.  How do I manage my blood sugar before surgery? . Check your blood sugar at least 4 times a day, starting 2 days before surgery, to make sure that the level is not too high or low. o Check your blood sugar the morning of your surgery when you wake up and every 2 hours until you get to the Short Stay unit. . If your blood sugar is less than 70 mg/dL, you will need to treat for low blood sugar: o Do not take insulin. o Treat a low blood sugar (less than 70 mg/dL) with  cup of clear juice (cranberry or apple), 4 glucose tablets, OR glucose gel. Recheck blood sugar in 15 minutes after treatment (to make sure it is greater than 70 mg/dL). If your blood sugar is not greater than 70 mg/dL on recheck, call [phone] o  for further instructions. . Report your blood sugar to the short stay nurse when you get to Short Stay.  . If you are admitted to the hospital after surgery: o Your blood sugar will be checked by the staff and you will probably be given insulin after surgery (instead of oral diabetes medicines) to make sure you have good  blood sugar levels. o The goal for blood sugar control after surgery is 80-180 mg/dL.  Reviewed and Endorsed by [facility] Patient Education Committee, August 2015  The Morning of Surgery  Do not wear jewelry, make-up or nail polish.  Do not wear lotions, powders, colognes, or deodorant  Do not shave 48 hours prior to surgery.   Men may shave face and neck.  Do not bring valuables to the hospital.  [facility] is not responsible for any belongings or valuables.  If you are a smoker, DO NOT Smoke 24 hours prior to surgery IF you wear a  CPAP at night please bring your mask, tubing, and machine the morning of surgery   Remember that you must have someone to transport you home after your surgery, and remain with you for 24 hours if you are discharged the same day.  Contacts, glasses, hearing aids, dentures or bridgework may not be worn into surgery.   Leave your suitcase in the car.  After surgery it may be brought to your room.  For patients admitted to the hospital, discharge time will be determined by your treatment team.  Patients discharged the day of surgery will not be allowed to drive home.   Special instructions:   [facility]- Preparing For Surgery  Before surgery, you can play an important role. Because skin is not sterile, your skin needs to be as free of germs as possible. You can reduce the number of germs on your skin by washing with CHG (chlorahexidine gluconate) Soap before surgery.  CHG is an antiseptic cleaner which kills germs and bonds with the skin to continue killing germs even after washing.    Oral Hygiene is also important to reduce your risk of infection.  Remember - BRUSH YOUR TEETH THE MORNING OF SURGERY WITH YOUR REGULAR TOOTHPASTE  Please do not use if you have an allergy to CHG or antibacterial soaps. If your skin becomes reddened/irritated stop using the CHG.  Do not shave (including legs and underarms) for at least 48 hours prior to first CHG shower. It is OK to shave your face.  Please follow these instructions carefully.   1. Shower the NIGHT BEFORE SURGERY and the MORNING OF SURGERY with CHG Soap.   2. If you chose to wash your hair, wash your hair first as usual with your normal shampoo.  3. After you shampoo, rinse your hair and body thoroughly to remove the shampoo.  4. Use CHG as you would any other liquid soap. You can apply CHG directly to the skin and wash gently with a scrungie or a clean washcloth.   5. Apply the CHG Soap to your body ONLY FROM THE NECK DOWN.  Do not use on  open wounds or open sores. Avoid contact with your eyes, ears, mouth and genitals (private parts). Wash Face and genitals (private parts)  with your normal soap.   6. Wash thoroughly, paying special attention to the area where your surgery will be performed.  7. Thoroughly rinse your body with warm water from the neck down.  8. DO NOT shower/wash with your normal soap after using and rinsing off the CHG Soap.  9. Pat yourself dry with a CLEAN TOWEL.  10. Wear CLEAN PAJAMAS to bed the night before surgery, wear comfortable clothes the morning of surgery  11. Place CLEAN SHEETS on your bed the night of your first shower and DO NOT SLEEP WITH PETS.    Day of Surgery:  Do not  apply any deodorants/lotions. Please shower the morning of surgery with the CHG soap  Please wear clean clothes to the hospital/surgery center.   Remember to brush your teeth WITH YOUR REGULAR TOOTHPASTE.   Please read over the fact sheets that you were given.

## 2019-02-18 NOTE — Patient Outreach (Signed)
[facility]) Care Management  02/18/2019  JONA STREAM [birth year] NR:[id number]  EMMI-stroke- d/c from [facility] 02/07/19  RED ON EMMI ALERT Day # 6 Date: Friday 02/15/19 [place] Reason: Questions/problems with meds? Yes    Insurance: united healthcare medicare   [facility] admissions x1  ED visits x 1 in the last 6 months    Outreach attempt # 1 Patient is able to verify HIPAA, DOB and address [place] Management RN reviewed and addressed red alert with patient   EMMI:  Mr Pennella denies questions/problems with medications. He reports EMMI automated system may have documented answer incorrectly He and CM reviewed the d/c, after summary visit He has all medications he was to start (ASA, plavix, pravastatin) and has stopped hi zetia  Social:  Mr CRISTIANO AKKERMAN is a 68 year old divorced retired patient who lives alone He had his brother for support He has an associates degree He reports being independent/assist with his care needs and denies issues with transportation to medical appointments  Conditions: stroke, bilateral carotid artery stenosis with planned 02/20/19 0915 endarterectomy Dr Curt Jews, depression with anxiety, immunocompromised, liver transplant, bladder wall thickening, CKD, DM1 insulin dependent  DME: eye glasses   Medications: He denies concerns with taking medications as prescribed, affording medications, side effects of medications and questions about medications    Appointments: 02/20/19 0915 [facility] periop Dr Sherren Mocha Early sur left carotid artery /endarterectomy surgery 04/09/19 1100 [facility] Richard Sater 6 months   Advance Directives: has POA was given forms on 02/18/19 to make changes He denies any needed assistance from [facility] for advance directives   Consent: [facility] RN CM reviewed [facility] services with patient. Patient gave verbal consent for services [facility] telephonic RN CM.   Advised patient that there will be further automated EMMI- post discharge  calls to assess how the patient is doing following the recent hospitalization Advised the patient that another call may be received from a nurse if any of their responses were abnormal. Patient voiced understanding and was appreciative of f/u call.   Plan: [facility] RN CM will close case at this time as patient has been assessed and no needs identified/needs resolved.   Pt encouraged to return a call to [place] CM prn  [facility] RN CM sent a successful outreach letter as discussed with [facility] brochure enclosed for review  Neytiri Asche L. Lavina Hamman, RN, BSN, [place] Coordinator Office number [phone] Mobile number [phone]  Main [facility] number [phone] Fax number [phone]

## 2019-02-19 ENCOUNTER — Encounter (HOSPITAL_COMMUNITY): Payer: Self-pay

## 2019-02-19 ENCOUNTER — Encounter: Payer: Self-pay | Admitting: *Deleted

## 2019-02-19 ENCOUNTER — Other Ambulatory Visit: Payer: Self-pay | Admitting: *Deleted

## 2019-02-19 MED ORDER — DEXTROSE 5 % IV SOLN
3.0000 g | INTRAVENOUS | Status: AC
  Administered 2019-02-20: 3 g via INTRAVENOUS
  Filled 2019-02-19: qty 3

## 2019-02-19 NOTE — Progress Notes (Signed)
Anesthesia Chart Review:  Case: [id number] Date/Time: 02/20/19 0915   Procedure: ENDARTERECTOMY CAROTID LEFT (Left )   Anesthesia type: General   Pre-op diagnosis: LEFT CAROTID ARTERY STENOSIS   Location: [facility] OR ROOM 11 / [facility] OR   Surgeon: Early, Todd F, MD      DISCUSSION: Patient is a 68-year-old male scheduled for the above procedure. Admitted 02/05/19 to [facility] with speech difficulties and diagnosed with acute anterolateral left thalamic stroke. Left carotid stenosis ("with unstable plaque with persistent fetal pattern of origin of the left posterior cerebral artery") felt to be the source of CVA. Seen by neurology and vascular surgery. Discharged home on ASA and Plavix and plans for out-patient left CEA. Had EF 50-55% with moderate AS and mild to moderate AR on echo (followed by cardiologist at [facility]).   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   His AS/AR is followed at [facility], last visit 10/31/18 with echo. AS was classified as moderate on 01/2019 echo.   He is seen yearly at [facility], last 03/2018. Note suggests he gets frequent labs monitoring in between visits through his PCP. PAT labs stable since his 01/2019 admission, although total bilirubin higher (1.7, up from 0.8-1.2) and platelet count lower (86K, down from ~ 100-130K) when compared to labs from 6-12 months ago. AST/ALT overall stable. PT/PTT mildly elevated (15.7/37) without recent comparison (prior to 02/05/19). (He is supposed to have PCP follow-up with labs with Dr. McFadden in November.)     He is to continue ASA and Plavix per [facility].  - Reviewed above with anesthesiologist Ellender, Ryan, MD. Patient with symptomatic left carotid disease. Known liver  transplant history and AV disease. Assigned anesthesiologist and/or surgeon to determine if they want platelets available for OR. Patient did have a T&S done with PAT labs.    VS: BP (!) (P) 129/47   Pulse (!) (P) 59   Temp (P) 37.4 C   Resp (P) 18   Ht (P) 5' 11.5" (1.816 m)   Wt (P) 120.2 kg   SpO2 (P) 100%   BMI (P) 36.43 kg/m    PROVIDERS: McFadden, John C., MD is PCP. Seen by Vaughan, Loren, PA-C on 02/21/19 for hospital follow-up and aware of left CEA plans. Close follow-up of LFTs recommended since statin therapy was added (given his liver transplant history). ST ordered. ([facility] Everywhere) - Miller, Paula, MD is cardiologist ([facility] Everywhere). Last visit 10/31/18 with Gazda, Casey, MD for AS follow-up. Occasional mild palpitations, so exertional dyspnea with stairs, but otherwise no exertional chest pain, orthopnea, PND, presyncope, syncope, and DOE with flat surfaces.  Six month follow-up recommended. - He is followed at the [facility]. Last visit 04/10/18 for annual follow-up. Labs every 1-2 months recommended with one year return to clinic visit.  - Sethi, Pramod, MD is neurologist (seen during 01/2019 CVA admission). Also saw Sater, Richard, MD on 10/08/18 for neck pain/cervical stenois with radiculopathy.   LABS: Preoperative labs noted. PLT count 86 K (previously 77-88K 01/2019; down from 111K 06/26/18 at [facility] and 103-134K in 2019 at [facility]), PT 15.7, INR 1.3, PTT 37, consistent with results from 01/2019 CVA admission. LFTs trends as below. A1c 6.1% on 02/06/19.  (all labs ordered are listed, but only abnormal results are displayed)  Labs   Reviewed  GLUCOSE, CAPILLARY - Abnormal; Notable for the following components:      Result Value   Glucose-Capillary 197 (*)    All other components within normal limits  APTT - Abnormal; Notable for the following components:   aPTT 37 (*)    All other components within normal limits  CBC -  Abnormal; Notable for the following components:   RBC 4.01 (*)    Hemoglobin 12.4 (*)    HCT 38.2 (*)    Platelets 86 (*)    All other components within normal limits  COMPREHENSIVE METABOLIC PANEL - Abnormal; Notable for the following components:   Glucose, Bld 216 (*)    Creatinine, Ser 1.27 (*)    Albumin 2.6 (*)    AST 81 (*)    Alkaline Phosphatase 201 (*)    Total Bilirubin 1.7 (*)    GFR calc non Af Amer 58 (*)    All other components within normal limits  PROTIME-INR - Abnormal; Notable for the following components:   Prothrombin Time 15.7 (*)    INR 1.3 (*)    All other components within normal limits  URINALYSIS, ROUTINE W REFLEX MICROSCOPIC - Abnormal; Notable for the following components:   Specific Gravity, Urine >1.030 (*)    Hgb urine dipstick MODERATE (*)    All other components within normal limits  URINALYSIS, MICROSCOPIC (REFLEX) - Abnormal; Notable for the following components:   Bacteria, UA RARE (*)    All other components within normal limits  SURGICAL PCR SCREEN  TYPE AND SCREEN  ABO/RH     02/18/19 02/07/19... 06/26/18 ([facility]) 04/10/18 ([facility]) Alk Phos  201  158  184   183 AST   81  66  49   75 ALT   44  32  23   34 Total Bilirubin  1.7  1.8  1.2   0.8 GGT         273   IMAGES: MRA head/neck 02/05/19: IMPRESSION: 1. Left carotid bifurcation atherosclerosis with 60-65 % Left ICA origin stenosis. 2. Otherwise negative neck MRA; tortuous proximal great vessels and vertebral arteries. 3. Negative intracranial MRA.  MRI Brain 02/05/19: IMPRESSION: Acute 2 cm infarction affecting the anterolateral left thalamus. No evidence of hemorrhage or mass effect.  PCXR 02/05/19: FINDINGS: Shallow inspiration. No focal consolidation, pleural effusion, pneumothorax. Mild diffuse interstitial coarsening. Stable cardiac silhouette. No acute osseous pathology. IMPRESSION: No active disease. No interval change.  MRI C-spine 11/03/18: IMPRESSION:  1.   At  C3-C4, there is mild anterolisthesis associated with disc protrusion, left facet hypertrophy and moderate spinal stenosis.  There is no nerve root compression.  The spondylolisthesis has developed since the 2018 MRI.  The spinal stenosis has worsened since that time. 2.   At C5-C6, there are degenerative changes causing borderline spinal stenosis and mild to moderate bilateral foraminal narrowing but no nerve root compression.  This level is stable compared to the 2018 MRI. 3.   At C6-C7, there are disc osteophyte complexes bilaterally, much more on the right.  There is moderately severe right foraminal narrowing that could lead to right C7 nerve root compression.  This level is stable compared to the 2018 MRI. 4.   Milder degenerative changes at the other cervical levels that appear to be stable and do not lead to nerve root compression or spinal stenosis. 5.   The spinal cord appears normal.  US Liver Transplant 04/10/18 ([facility]) Result Impression --Patent hepatic transplant vasculature.  --Elevated   resistive indices in the common and right hepatic arteries, slightly decreased from prior. Resistive index in the left hepatic artery within normal limits.  --Splenomegaly, unchanged.    EKG: Sinus rhythm with 1st degree A-V block with Premature supraventricular complexes Left axis deviation Left ventricular hypertrophy Confirmed by Steinl, Kevin ([id number]) on 02/05/2019 1:38:42 PM - Artifact present, worse in leads V2, V5-6. Question of artifact or non-conducted p-waves I, III.     CV: Carotid US 02/07/19: Summary: - Right Carotid: Velocities in the right ICA are consistent with a 1-39% stenosis. - Left Carotid: Velocities in the left ICA are consistent with a 40-59% stenosis. - Vertebrals:  Bilateral vertebral arteries demonstrate antegrade flow. - Subclavians: Normal flow hemodynamics were seen in bilateral subclavian arteries.   Echo 02/06/19: IMPRESSIONS  1. Left ventricular  ejection fraction, by visual estimation, is 50 to 55%. The left ventricle has normal function. There is borderline left ventricular hypertrophy.  2. Technically difficult study. Parasternal views limited, cannot clearly assess wall thickness. No gross wall motion abnormalities but cannot exclude small focal abnormalities.  3. Global right ventricle was not well visualized.The right ventricular size is not well visualized. Right vetricular wall thickness was not assessed.  4. Left atrial size was normal.  5. Right atrial size was mild-moderately dilated.  6. The pericardium was not well visualized.  7. The mitral valve is grossly normal. Trace mitral valve regurgitation.  8. The tricuspid valve is grossly normal. Tricuspid valve regurgitation is trivial.  9. The aortic valve has an indeterminant number of cusps Aortic valve regurgitation is mild to moderate by color flow Doppler. Moderate aortic valve stenosis. 10. There is Moderate calcification of the aortic valve. 11. There is Moderate thickening of the aortic valve. 12. Cannot exclude bicuspid aortic valve, difficult image window. Moderate AS and mild-moderate AR. AORTIC VALVE AV Area (Vmax):    2.32 cm AV Area (Vmean):   2.47 cm AV Area (VTI):     2.52 cm AV Vmax:           278.50 cm/s AV Vmean:          194.000 cm/s AV VTI:            0.628 m AV Peak Grad:      31.0 mmHg AV Mean Grad:      24.0 mmHg LVOT Vmax:         143.00 cm/s LVOT Vmean:        106.000 cm/s LVOT VTI:          0.350 m LVOT/AV VTI ratio: 0.56 13. The pulmonic valve was grossly normal. Pulmonic valve regurgitation is not visualized by color flow Doppler. 14. Aortic dilatation noted. 15. There is moderate dilatation of the ascending aorta measuring 42 mm. 16. Mildly elevated pulmonary artery systolic pressure. (Comparison 11/05/18 at [facility]: EF > 55%, grade II DD, moderate-severe AS, mild-moderate AR, AV peak velocity 4.3 m/s, AV peak gradient 60.0, AV mean gradient  35 mmHg, mild TR, borderline elevated PASP)   Past Medical History:  Diagnosis Date  . Acid reflux   . Anxiety   . Aortic stenosis   . Bowel cancer (HCC)   . Chronic kidney disease   . Colon cancer (HCC)   . Colon polyp   . Depression   . Diabetes mellitus without complication (HCC)   . Heart murmur   . High cholesterol   . Hypertension   . Liver transplant recipient (HCC)   . Neuropathy   . PONV (  postoperative nausea and vomiting)     Past Surgical History:  Procedure Laterality Date  . CHOLECYSTECTOMY    . COLON SURGERY    . KNEE SURGERY  1968  . LIVER TRANSPLANT  1996  . TONSILLECTOMY      MEDICATIONS: . aspirin EC 81 MG EC tablet  . clopidogrel (PLAVIX) 75 MG tablet  . clotrimazole (LOTRIMIN) 1 % cream  . cyclobenzaprine (FLEXERIL) 10 MG tablet  . escitalopram (LEXAPRO) 10 MG tablet  . famotidine (PEPCID) 20 MG tablet  . gabapentin (NEURONTIN) 300 MG capsule  . insulin lispro protamine-lispro (HUMALOG 75/25 MIX) (75-25) 100 UNIT/ML SUSP injection  . loperamide (IMODIUM) 2 MG capsule  . mupirocin ointment (BACTROBAN) 2 %  . mycophenolate (MYFORTIC) 180 MG EC tablet  . nebivolol (BYSTOLIC) 5 MG tablet  . pravastatin (PRAVACHOL) 80 MG tablet  . sildenafil (VIAGRA) 100 MG tablet  . sodium bicarbonate 650 MG tablet  . Sodium Fluoride (PREVIDENT 5000 BOOSTER PLUS) 1.1 % PSTE  . tamsulosin (FLOMAX) 0.4 MG CAPS capsule  . traMADol (ULTRAM) 50 MG tablet  . Vitamin D, Ergocalciferol, (DRISDOL) 1.25 MG (50000 UT) CAPS capsule   No current facility-administered medications for this encounter.     Allison Zelenak, PA-C Surgical Short Stay/Anesthesiology [facility] Phone [phone] [facility] Phone [phone] 02/19/2019 1:18 PM       

## 2019-02-19 NOTE — Anesthesia Preprocedure Evaluation (Addendum)
Anesthesia Evaluation  Patient identified by MRN, date of birth, ID band Patient awake    Reviewed: Allergy & Precautions, NPO status , Patient's Chart, lab work & pertinent test results  History of Anesthesia Complications (+) PONV  Airway Mallampati: I  TM Distance: >3 FB Neck ROM: Full    Dental   Pulmonary former smoker,    Pulmonary exam normal        Cardiovascular hypertension, Normal cardiovascular exam+ Valvular Problems/Murmurs AS      Neuro/Psych Anxiety Depression CVA    GI/Hepatic S/P liver transplant 1996   Endo/Other  diabetes, Type 2, Insulin Dependent  Renal/GU      Musculoskeletal   Abdominal   Peds  Hematology   Anesthesia Other Findings   Reproductive/Obstetrics                            Anesthesia Physical Anesthesia Plan  ASA: III  Anesthesia Plan: General   Post-op Pain Management:    Induction: Intravenous  PONV Risk Score and Plan: 3 and Midazolam, Ondansetron and Treatment may vary due to age or medical condition  Airway Management Planned: Oral ETT  Additional Equipment: Arterial line  Intra-op Plan:   Post-operative Plan: Extubation in OR  Informed Consent: I have reviewed the patients History and Physical, chart, labs and discussed the procedure including the risks, benefits and alternatives for the proposed anesthesia with the patient or authorized representative who has indicated his/her understanding and acceptance.       Plan Discussed with: CRNA and Surgeon  Anesthesia Plan Comments: (See PAT note written 02/19/2019 by Myra Gianotti, PA-C and labs. History of liver transplant, moderate AS, CVA 01/2019.   )       Anesthesia Quick Evaluation

## 2019-02-19 NOTE — Patient Outreach (Addendum)
[facility]) Care Management  02/19/2019  ZYLER GAYE [birth year] [id number]   EMMI- stroke  RED ON EMMI ALERT Day # 3 Date: 02/18/2019 1001 Red Alert Reason: Feeling worse overall? Yes Sad, hopeless, anxious, or empty? Yes   Insurance: united healthcare medicare and medicaid of [place]  [facility] admissions x 1 ED visits x 1 in the last 6 months  Date of Admission: 02/05/2019 Date of Discharge: 02/07/2019  Outreach attempt #1 successful to the home number  Patient is able to verify HIPAA, DOB and address [facility] Care Management RN reviewed and addressed red alert with patient   EMMI:  Mr Ngai reports that he does not feel worse medically but he states "I have been by myself for the last couple of days and been thinking about what they are goring to do to me tomorrow." and he was thinking about the "few weeks" he has been out of work. He is referring to his scheduled endarterectomy on 02/20/19  He states his feelings varies. [facility] RN CM reviewed [facility] SW services and he agrees to a [facility] SW referral for assistance with his "sad, hopeless, anxious, or empty" feelings about his upcoming procedure and Job status. He states he has not been told what to expect. He states he was not given any idea or "directions" during pre op services on 02/18/19.He reports he was lost when he went to [facility] on 02/18/19 " I went in one way and was brought out another"    [facility] RN CM reviewed with him that he is scheduled to arrive at 0730 on 02/20/19 to the [facility] main entrance near the emergency room in the circle near the [place] staff. His brother will be with him on 02/20/19. [facility] RN CM discussed the parking area and the waiting area. [facility] RN CM discussed his registration process and his OR preparatory process with the nursing staff (vitals, iv access, etc). [facility] RN CM discussed visits with his provider and anesthesiology staff. [facility] RN CM discussed side effects of anesthesia and encouraged him  to discuss this with the nurse during pre surgery interventions. [name] RN CM discussed the recovery process. [facility] RN CM assessed for ADLs and is reports he is fairly independent. He denies need of DME. [facility] RN CM discussed that if he or the hospital staff felt he need DME/home care services prior to d/c. The services and/or the DME could be ordered at the hospital. [facility] RN CM discussed that he and his brother will receive discharge instructions/after summary sheets for home care. [facility] RN CM encouraged him to use his united healthcare over the counter catalog to order DME he think he may need in the home after the procedure like a BP cuff. He reports he will order a BP cuff and review it for other items he may need. [facility] RN CM discuss that [facility] RN CM will follow up with him after he is d/c home He voiced understanding and appreciation  He reports his employer is aware of his illness and need to have his procedure but he is still concern about being out of work, loss of income and insecurity of the future    Social:  Mr JORDANNY DAHLEN is a 68 year old divorced retired patient who lives alone He had his brother for support He has an associates degree He reports being independent/assist with his care needs and denies issues with transportation to medical appointments He reports he is not a morning person  Conditions: Anterolateral Left Thalamic Stroke, bilateral carotid artery stenosis with planned 02/20/19 0915 endarterectomy Dr Curt Jews, 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) , immunocompromised, hx of liver transplant, bladder wall thickening, CKD, DM2 insulin dependent, HLD  DME: eye glasses   Medications: He denies concerns with taking medications as prescribed, affording medications, side effects of medications and questions about medications   Appointments: 02/20/19 0915 [facility] peri op Dr Sherren Mocha Early sur left carotid artery /endarterectomy surgery 04/09/19 1100 [facility] [employer] 6  months   Advance Directives: has POA was given forms on 02/18/19 to make changes He denies any needed assistance from [facility] for advance directives   Consent: [facility] RN CM reviewed [facility] services with patient. Patient gave verbal consent for services [facility] telephonic RN CM.   Advised patient that there will be further automated EMMI-post discharge calls to assess how the patient is doing following the recent hospitalization Advised the patient that another call may be received from a nurse if any of their responses were abnormal. Patient voiced understanding and was appreciative of f/u call.   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 Lavina Hamman, RN, BSN, [place] Coordinator Office number [phone] Mobile number [phone]  Main [facility] number [phone] Fax number [phone]

## 2019-02-20 ENCOUNTER — Inpatient Hospital Stay (HOSPITAL_COMMUNITY): Payer: Medicare Other | Admitting: Certified Registered Nurse Anesthetist

## 2019-02-20 ENCOUNTER — Inpatient Hospital Stay (HOSPITAL_COMMUNITY): Payer: Medicare Other | Admitting: Anesthesiology

## 2019-02-20 ENCOUNTER — Encounter (HOSPITAL_COMMUNITY)
Admission: RE | Disposition: A | Discharge status: Home / Self Care | Payer: Self-pay | Source: Home / Self Care | Attending: Vascular Surgery

## 2019-02-20 ENCOUNTER — Encounter (HOSPITAL_COMMUNITY): Payer: Self-pay

## 2019-02-20 ENCOUNTER — Other Ambulatory Visit: Payer: Self-pay

## 2019-02-20 ENCOUNTER — Inpatient Hospital Stay (HOSPITAL_COMMUNITY): Payer: Medicare Other

## 2019-02-20 ENCOUNTER — Inpatient Hospital Stay (HOSPITAL_COMMUNITY)
Admission: RE | Admit: 2019-02-20 | Discharge: 2019-02-23 | DRG: 038 | Disposition: A | Discharge status: Home / Self Care | Payer: Medicare Other | Attending: Vascular Surgery | Admitting: Vascular Surgery

## 2019-02-20 DIAGNOSIS — R4701 Aphasia: Secondary | ICD-10-CM | POA: Diagnosis present

## 2019-02-20 DIAGNOSIS — E1122 Type 2 diabetes mellitus with diabetic chronic kidney disease: Secondary | ICD-10-CM | POA: Diagnosis present

## 2019-02-20 DIAGNOSIS — Z794 Long term (current) use of insulin: Secondary | ICD-10-CM | POA: Diagnosis not present

## 2019-02-20 DIAGNOSIS — I129 Hypertensive chronic kidney disease with stage 1 through stage 4 chronic kidney disease, or unspecified chronic kidney disease: Secondary | ICD-10-CM | POA: Diagnosis present

## 2019-02-20 DIAGNOSIS — Z20828 Contact with and (suspected) exposure to other viral communicable diseases: Secondary | ICD-10-CM | POA: Diagnosis present

## 2019-02-20 DIAGNOSIS — Z85038 Personal history of other malignant neoplasm of large intestine: Secondary | ICD-10-CM

## 2019-02-20 DIAGNOSIS — Z944 Liver transplant status: Secondary | ICD-10-CM

## 2019-02-20 DIAGNOSIS — Z7982 Long term (current) use of aspirin: Secondary | ICD-10-CM

## 2019-02-20 DIAGNOSIS — I6522 Occlusion and stenosis of left carotid artery: Principal | ICD-10-CM | POA: Diagnosis present

## 2019-02-20 DIAGNOSIS — Y838 Other surgical procedures as the cause of abnormal reaction of the patient, or of later complication, without mention of misadventure at the time of the procedure: Secondary | ICD-10-CM | POA: Diagnosis not present

## 2019-02-20 DIAGNOSIS — Z809 Family history of malignant neoplasm, unspecified: Secondary | ICD-10-CM

## 2019-02-20 DIAGNOSIS — Z7902 Long term (current) use of antithrombotics/antiplatelets: Secondary | ICD-10-CM

## 2019-02-20 DIAGNOSIS — I959 Hypotension, unspecified: Secondary | ICD-10-CM | POA: Diagnosis not present

## 2019-02-20 DIAGNOSIS — N189 Chronic kidney disease, unspecified: Secondary | ICD-10-CM | POA: Diagnosis present

## 2019-02-20 DIAGNOSIS — Z888 Allergy status to other drugs, medicaments and biological substances status: Secondary | ICD-10-CM

## 2019-02-20 DIAGNOSIS — K219 Gastro-esophageal reflux disease without esophagitis: Secondary | ICD-10-CM | POA: Diagnosis present

## 2019-02-20 DIAGNOSIS — M4802 Spinal stenosis, cervical region: Secondary | ICD-10-CM | POA: Diagnosis present

## 2019-02-20 DIAGNOSIS — Z8249 Family history of ischemic heart disease and other diseases of the circulatory system: Secondary | ICD-10-CM

## 2019-02-20 DIAGNOSIS — E1140 Type 2 diabetes mellitus with diabetic neuropathy, unspecified: Secondary | ICD-10-CM | POA: Diagnosis present

## 2019-02-20 DIAGNOSIS — Z79899 Other long term (current) drug therapy: Secondary | ICD-10-CM | POA: Diagnosis not present

## 2019-02-20 DIAGNOSIS — I97638 Postprocedural hematoma of a circulatory system organ or structure following other circulatory system procedure: Secondary | ICD-10-CM

## 2019-02-20 DIAGNOSIS — E7800 Pure hypercholesterolemia, unspecified: Secondary | ICD-10-CM | POA: Diagnosis present

## 2019-02-20 DIAGNOSIS — I6529 Occlusion and stenosis of unspecified carotid artery: Secondary | ICD-10-CM | POA: Diagnosis present

## 2019-02-20 DIAGNOSIS — Z8673 Personal history of transient ischemic attack (TIA), and cerebral infarction without residual deficits: Secondary | ICD-10-CM | POA: Diagnosis not present

## 2019-02-20 DIAGNOSIS — Z87891 Personal history of nicotine dependence: Secondary | ICD-10-CM

## 2019-02-20 HISTORY — PX: EMBOLECTOMY: SHX44

## 2019-02-20 HISTORY — PX: PATCH ANGIOPLASTY: SHX6230

## 2019-02-20 HISTORY — PX: ENDARTERECTOMY: SHX5162

## 2019-02-20 LAB — POCT I-STAT, CHEM 8
BUN: 19 mg/dL (ref 8–23)
Calcium, Ion: 1.2 mmol/L (ref 1.15–1.40)
Chloride: 105 mmol/L (ref 98–111)
Creatinine, Ser: 1.3 mg/dL — ABNORMAL HIGH (ref 0.61–1.24)
Glucose, Bld: 126 mg/dL — ABNORMAL HIGH (ref 70–99)
HCT: 31 % — ABNORMAL LOW (ref 39.0–52.0)
Hemoglobin: 10.5 g/dL — ABNORMAL LOW (ref 13.0–17.0)
Potassium: 5.2 mmol/L — ABNORMAL HIGH (ref 3.5–5.1)
Sodium: 137 mmol/L (ref 135–145)
TCO2: 24 mmol/L (ref 22–32)

## 2019-02-20 LAB — GLUCOSE, CAPILLARY
Glucose-Capillary: 97 mg/dL (ref 70–99)
Glucose-Capillary: 113 mg/dL — ABNORMAL HIGH (ref 70–99)
Glucose-Capillary: 113 mg/dL — ABNORMAL HIGH (ref 70–99)
Glucose-Capillary: 129 mg/dL — ABNORMAL HIGH (ref 70–99)

## 2019-02-20 LAB — CBC
HCT: 31.7 % — ABNORMAL LOW (ref 39.0–52.0)
Hemoglobin: 10.2 g/dL — ABNORMAL LOW (ref 13.0–17.0)
MCH: 31.2 pg (ref 26.0–34.0)
MCHC: 32.2 g/dL (ref 30.0–36.0)
MCV: 96.9 fL (ref 80.0–100.0)
Platelets: 76 10*3/uL — ABNORMAL LOW (ref 150–400)
RBC: 3.27 MIL/uL — ABNORMAL LOW (ref 4.22–5.81)
RDW: 15 % (ref 11.5–15.5)
WBC: 7 10*3/uL (ref 4.0–10.5)
nRBC: 0 % (ref 0.0–0.2)

## 2019-02-20 LAB — CREATININE, SERUM
Creatinine, Ser: 1.41 mg/dL — ABNORMAL HIGH (ref 0.61–1.24)
GFR calc Af Amer: 59 mL/min — ABNORMAL LOW (ref >60)
GFR calc non Af Amer: 51 mL/min — ABNORMAL LOW (ref >60)

## 2019-02-20 LAB — HEMOGLOBIN A1C
Hgb A1c MFr Bld: 6 % — ABNORMAL HIGH (ref 4.8–5.6)
Mean Plasma Glucose: 125.5 mg/dL

## 2019-02-20 SURGERY — ENDARTERECTOMY, CAROTID
Anesthesia: General | Site: Neck | Laterality: Left

## 2019-02-20 SURGERY — EMBOLECTOMY
Anesthesia: General | Site: Neck | Laterality: Left

## 2019-02-20 MED ORDER — HYDROMORPHONE HCL 1 MG/ML IJ SOLN
INTRAMUSCULAR | Status: AC
  Filled 2019-02-20: qty 1

## 2019-02-20 MED ORDER — ONDANSETRON HCL 4 MG/2ML IJ SOLN: 4.0000 mg | Freq: Four times a day (QID) | INTRAMUSCULAR | Status: DC | PRN

## 2019-02-20 MED ORDER — SODIUM CHLORIDE 0.9 % NICU IV INFUSION SIMPLE
250.0000 mL | INJECTION | Freq: Once | INTRAVENOUS | Status: DC
  Administered 2019-02-20: 15:00:00 250 mL via INTRAVENOUS

## 2019-02-20 MED ORDER — HYDROMORPHONE HCL 1 MG/ML IJ SOLN
0.2500 mg | INTRAMUSCULAR | Status: DC | PRN
  Administered 2019-02-20 (×3): 0.25 mg via INTRAVENOUS

## 2019-02-20 MED ORDER — CHLORHEXIDINE GLUCONATE CLOTH 2 % EX PADS
6.0000 | MEDICATED_PAD | Freq: Every day | CUTANEOUS | Status: DC
  Administered 2019-02-21: 10:00:00 6 via TOPICAL

## 2019-02-20 MED ORDER — ACETAMINOPHEN 325 MG RE SUPP: 325.0000 mg | RECTAL | Status: DC | PRN

## 2019-02-20 MED ORDER — ESCITALOPRAM OXALATE 10 MG PO TABS
10.0000 mg | ORAL_TABLET | Freq: Every day | ORAL | Status: DC
  Administered 2019-02-21 – 2019-02-23 (×3): 10 mg via ORAL
  Filled 2019-02-20 (×3): qty 1

## 2019-02-20 MED ORDER — ROCURONIUM BROMIDE 50 MG/5ML IV SOSY
PREFILLED_SYRINGE | INTRAVENOUS | Status: DC | PRN
  Administered 2019-02-20: 100 mg via INTRAVENOUS

## 2019-02-20 MED ORDER — MUPIROCIN 2 % EX OINT: 1.0000 "application " | TOPICAL_OINTMENT | Freq: Two times a day (BID) | CUTANEOUS | Status: DC | PRN

## 2019-02-20 MED ORDER — VITAMIN D (ERGOCALCIFEROL) 1.25 MG (50000 UNIT) PO CAPS
50000.0000 [IU] | ORAL_CAPSULE | ORAL | Status: DC
  Administered 2019-02-21: 50000 [IU] via ORAL
  Filled 2019-02-20: qty 1

## 2019-02-20 MED ORDER — SODIUM CHLORIDE 0.9 % IV SOLN
500.0000 mL | Freq: Once | INTRAVENOUS | Status: AC | PRN
  Administered 2019-02-20: 15:00:00 500 mL via INTRAVENOUS

## 2019-02-20 MED ORDER — PROPOFOL 10 MG/ML IV BOLUS
INTRAVENOUS | Status: DC | PRN
  Administered 2019-02-20: 80 mg via INTRAVENOUS

## 2019-02-20 MED ORDER — PANTOPRAZOLE SODIUM 40 MG PO TBEC
40.0000 mg | DELAYED_RELEASE_TABLET | Freq: Every day | ORAL | Status: DC
  Administered 2019-02-20 – 2019-02-23 (×4): 40 mg via ORAL
  Filled 2019-02-20 (×4): qty 1

## 2019-02-20 MED ORDER — SODIUM CHLORIDE (PF) 0.9 % IJ SOLN
INTRAMUSCULAR | Status: AC
  Filled 2019-02-20: qty 10

## 2019-02-20 MED ORDER — DOPAMINE-DEXTROSE 3.2-5 MG/ML-% IV SOLN
5.0000 ug/kg/min | INTRAVENOUS | Status: DC
  Administered 2019-02-20 – 2019-02-21 (×2): 5 ug/kg/min via INTRAVENOUS
  Filled 2019-02-20: qty 250

## 2019-02-20 MED ORDER — PROMETHAZINE HCL 25 MG/ML IJ SOLN
12.5000 mg | INTRAMUSCULAR | Status: DC | PRN
  Administered 2019-02-20: 19:00:00 12.5 mg via INTRAVENOUS
  Filled 2019-02-20: qty 1

## 2019-02-20 MED ORDER — HYDRALAZINE HCL 20 MG/ML IJ SOLN: 5.0000 mg | INTRAMUSCULAR | Status: DC | PRN

## 2019-02-20 MED ORDER — FENTANYL CITRATE (PF) 100 MCG/2ML IJ SOLN
INTRAMUSCULAR | Status: DC | PRN
  Administered 2019-02-20: 150 ug via INTRAVENOUS
  Administered 2019-02-20 (×2): 50 ug via INTRAVENOUS

## 2019-02-20 MED ORDER — SUGAMMADEX SODIUM 200 MG/2ML IV SOLN
INTRAVENOUS | Status: DC | PRN
  Administered 2019-02-20: 200 mg via INTRAVENOUS

## 2019-02-20 MED ORDER — EPHEDRINE SULFATE 50 MG/ML IJ SOLN
INTRAMUSCULAR | Status: DC | PRN
  Administered 2019-02-20 (×2): 10 mg via INTRAVENOUS
  Administered 2019-02-20 (×2): 5 mg via INTRAVENOUS
  Administered 2019-02-20 (×2): 10 mg via INTRAVENOUS

## 2019-02-20 MED ORDER — LACTATED RINGERS IV SOLN
INTRAVENOUS | Status: DC | PRN
  Administered 2019-02-20: 10:00:00 via INTRAVENOUS

## 2019-02-20 MED ORDER — FENTANYL CITRATE (PF) 250 MCG/5ML IJ SOLN
INTRAMUSCULAR | Status: DC | PRN
  Administered 2019-02-20: 150 ug via INTRAVENOUS
  Administered 2019-02-20: 50 ug via INTRAVENOUS

## 2019-02-20 MED ORDER — LOPERAMIDE HCL 2 MG PO CAPS
6.0000 mg | ORAL_CAPSULE | Freq: Two times a day (BID) | ORAL | Status: DC
  Administered 2019-02-20 – 2019-02-23 (×5): 6 mg via ORAL
  Filled 2019-02-20 (×6): qty 3

## 2019-02-20 MED ORDER — METOPROLOL TARTRATE 5 MG/5ML IV SOLN: 2.0000 mg | INTRAVENOUS | Status: DC | PRN

## 2019-02-20 MED ORDER — PRAVASTATIN SODIUM 40 MG PO TABS
80.0000 mg | ORAL_TABLET | Freq: Every day | ORAL | Status: DC
  Administered 2019-02-20 – 2019-02-22 (×3): 80 mg via ORAL
  Filled 2019-02-20 (×3): qty 2

## 2019-02-20 MED ORDER — ONDANSETRON HCL 4 MG/2ML IJ SOLN
INTRAMUSCULAR | Status: AC
  Filled 2019-02-20: qty 2

## 2019-02-20 MED ORDER — TRAMADOL HCL 50 MG PO TABS
50.0000 mg | ORAL_TABLET | Freq: Two times a day (BID) | ORAL | Status: DC | PRN
  Administered 2019-02-21 – 2019-02-22 (×2): 50 mg via ORAL
  Filled 2019-02-20 (×2): qty 1

## 2019-02-20 MED ORDER — DOCUSATE SODIUM 100 MG PO CAPS
100.0000 mg | ORAL_CAPSULE | Freq: Every day | ORAL | Status: DC
  Administered 2019-02-22: 09:00:00 100 mg via ORAL
  Filled 2019-02-20 (×3): qty 1

## 2019-02-20 MED ORDER — SODIUM FLUORIDE 1.1 % DT PSTE: 1.0000 "application " | PASTE | Freq: Two times a day (BID) | DENTAL | Status: DC

## 2019-02-20 MED ORDER — SODIUM CHLORIDE 0.9 % IV SOLN
INTRAVENOUS | Status: DC | PRN
  Administered 2019-02-20: 500 mL

## 2019-02-20 MED ORDER — POTASSIUM CHLORIDE CRYS ER 20 MEQ PO TBCR: 20.0000 meq | EXTENDED_RELEASE_TABLET | Freq: Every day | ORAL | Status: DC | PRN

## 2019-02-20 MED ORDER — EPHEDRINE 5 MG/ML INJ
INTRAVENOUS | Status: AC
  Filled 2019-02-20: qty 10

## 2019-02-20 MED ORDER — CHLORHEXIDINE GLUCONATE 4 % EX LIQD: 60.0000 mL | Freq: Once | CUTANEOUS | Status: DC

## 2019-02-20 MED ORDER — ETOMIDATE 2 MG/ML IV SOLN
INTRAVENOUS | Status: DC | PRN
  Administered 2019-02-20: 20 mg via INTRAVENOUS

## 2019-02-20 MED ORDER — SODIUM CHLORIDE 0.9 % IV SOLN
INTRAVENOUS | Status: AC
  Filled 2019-02-20: qty 1.2

## 2019-02-20 MED ORDER — ACETAMINOPHEN 325 MG PO TABS: 325.0000 mg | ORAL_TABLET | ORAL | Status: DC | PRN

## 2019-02-20 MED ORDER — CYCLOBENZAPRINE HCL 10 MG PO TABS: 10.0000 mg | ORAL_TABLET | Freq: Three times a day (TID) | ORAL | Status: DC | PRN

## 2019-02-20 MED ORDER — MYCOPHENOLATE SODIUM 180 MG PO TBEC
360.0000 mg | DELAYED_RELEASE_TABLET | Freq: Two times a day (BID) | ORAL | Status: DC
  Administered 2019-02-20 – 2019-02-23 (×6): 360 mg via ORAL
  Filled 2019-02-20 (×7): qty 2

## 2019-02-20 MED ORDER — PHENYLEPHRINE HCL-NACL 10-0.9 MG/250ML-% IV SOLN
INTRAVENOUS | Status: DC | PRN
  Administered 2019-02-20: 25 ug/min via INTRAVENOUS

## 2019-02-20 MED ORDER — ONDANSETRON HCL 4 MG/2ML IJ SOLN
4.0000 mg | Freq: Once | INTRAMUSCULAR | Status: AC | PRN
  Administered 2019-02-20: 18:00:00 4 mg via INTRAVENOUS

## 2019-02-20 MED ORDER — PHENYLEPHRINE HCL (PRESSORS) 10 MG/ML IV SOLN
INTRAVENOUS | Status: AC
  Filled 2019-02-20: qty 1

## 2019-02-20 MED ORDER — SODIUM CHLORIDE 0.9 % IV SOLN: INTRAVENOUS | Status: DC

## 2019-02-20 MED ORDER — GABAPENTIN 300 MG PO CAPS
300.0000 mg | ORAL_CAPSULE | Freq: Three times a day (TID) | ORAL | Status: DC
  Administered 2019-02-20 – 2019-02-23 (×8): 300 mg via ORAL
  Filled 2019-02-20 (×8): qty 1

## 2019-02-20 MED ORDER — PROTAMINE SULFATE 10 MG/ML IV SOLN
INTRAVENOUS | Status: DC | PRN
  Administered 2019-02-20: 50 mg via INTRAVENOUS

## 2019-02-20 MED ORDER — LABETALOL HCL 5 MG/ML IV SOLN: 10.0000 mg | INTRAVENOUS | Status: DC | PRN

## 2019-02-20 MED ORDER — LIDOCAINE 2% (20 MG/ML) 5 ML SYRINGE
INTRAMUSCULAR | Status: DC | PRN
  Administered 2019-02-20: 100 mg via INTRAVENOUS

## 2019-02-20 MED ORDER — SODIUM CHLORIDE 0.9 % IV SOLN
INTRAVENOUS | Status: DC
  Administered 2019-02-20 – 2019-02-22 (×3): via INTRAVENOUS

## 2019-02-20 MED ORDER — FAMOTIDINE 20 MG PO TABS
20.0000 mg | ORAL_TABLET | Freq: Every day | ORAL | Status: DC
  Administered 2019-02-21 – 2019-02-23 (×3): 20 mg via ORAL
  Filled 2019-02-20 (×3): qty 1

## 2019-02-20 MED ORDER — SENNOSIDES-DOCUSATE SODIUM 8.6-50 MG PO TABS: 1.0000 | ORAL_TABLET | Freq: Every evening | ORAL | Status: DC | PRN

## 2019-02-20 MED ORDER — ROCURONIUM BROMIDE 10 MG/ML (PF) SYRINGE
PREFILLED_SYRINGE | INTRAVENOUS | Status: AC
  Filled 2019-02-20: qty 10

## 2019-02-20 MED ORDER — PHENYLEPHRINE HCL-NACL 10-0.9 MG/250ML-% IV SOLN
INTRAVENOUS | Status: DC | PRN
  Administered 2019-02-20: 50 ug/min via INTRAVENOUS

## 2019-02-20 MED ORDER — MIDAZOLAM HCL 2 MG/2ML IJ SOLN
INTRAMUSCULAR | Status: AC
  Filled 2019-02-20: qty 2

## 2019-02-20 MED ORDER — CEFAZOLIN SODIUM 1 G IJ SOLR
INTRAMUSCULAR | Status: AC
  Filled 2019-02-20: qty 20

## 2019-02-20 MED ORDER — ETOMIDATE 2 MG/ML IV SOLN
INTRAVENOUS | Status: AC
  Filled 2019-02-20: qty 10

## 2019-02-20 MED ORDER — OXYCODONE HCL 5 MG PO TABS: 5.0000 mg | ORAL_TABLET | ORAL | Status: DC | PRN

## 2019-02-20 MED ORDER — ONDANSETRON HCL 4 MG/2ML IJ SOLN: 4.0000 mg | INTRAMUSCULAR | Status: DC | PRN

## 2019-02-20 MED ORDER — ONDANSETRON HCL 4 MG/2ML IJ SOLN
INTRAMUSCULAR | Status: DC | PRN
  Administered 2019-02-20: 4 mg via INTRAVENOUS

## 2019-02-20 MED ORDER — ORAL CARE MOUTH RINSE
15.0000 mL | Freq: Two times a day (BID) | OROMUCOSAL | Status: DC
  Administered 2019-02-20 – 2019-02-23 (×4): 15 mL via OROMUCOSAL

## 2019-02-20 MED ORDER — LIDOCAINE HCL (PF) 1 % IJ SOLN
INTRAMUSCULAR | Status: AC
  Filled 2019-02-20: qty 30

## 2019-02-20 MED ORDER — TAMSULOSIN HCL 0.4 MG PO CAPS
0.4000 mg | ORAL_CAPSULE | Freq: Every day | ORAL | Status: DC
  Administered 2019-02-21 – 2019-02-23 (×3): 0.4 mg via ORAL
  Filled 2019-02-20 (×3): qty 1

## 2019-02-20 MED ORDER — HEPARIN SODIUM (PORCINE) 5000 UNIT/ML IJ SOLN
5000.0000 [IU] | Freq: Three times a day (TID) | INTRAMUSCULAR | Status: DC
  Administered 2019-02-21 – 2019-02-23 (×7): 5000 [IU] via SUBCUTANEOUS
  Filled 2019-02-20 (×7): qty 1

## 2019-02-20 MED ORDER — HEPARIN SODIUM (PORCINE) 1000 UNIT/ML IJ SOLN
INTRAMUSCULAR | Status: DC | PRN
  Administered 2019-02-20: 11000 [IU] via INTRAVENOUS

## 2019-02-20 MED ORDER — INSULIN ASPART 100 UNIT/ML ~~LOC~~ SOLN
0.0000 [IU] | Freq: Three times a day (TID) | SUBCUTANEOUS | Status: DC
  Administered 2019-02-21 – 2019-02-22 (×3): 2 [IU] via SUBCUTANEOUS

## 2019-02-20 MED ORDER — FENTANYL CITRATE (PF) 250 MCG/5ML IJ SOLN
INTRAMUSCULAR | Status: AC
  Filled 2019-02-20: qty 5

## 2019-02-20 MED ORDER — 0.9 % SODIUM CHLORIDE (POUR BTL) OPTIME
TOPICAL | Status: DC | PRN
  Administered 2019-02-20: 2000 mL

## 2019-02-20 MED ORDER — LIDOCAINE 2% (20 MG/ML) 5 ML SYRINGE
INTRAMUSCULAR | Status: AC
  Filled 2019-02-20: qty 5

## 2019-02-20 MED ORDER — MEPERIDINE HCL 25 MG/ML IJ SOLN: 6.2500 mg | INTRAMUSCULAR | Status: DC | PRN

## 2019-02-20 MED ORDER — SODIUM CHLORIDE 0.9 % IV SOLN
Freq: Once | INTRAVENOUS | Status: AC
  Administered 2019-02-20: 15:00:00 via INTRAVENOUS

## 2019-02-20 MED ORDER — DOPAMINE-DEXTROSE 3.2-5 MG/ML-% IV SOLN: 5.0000 ug/kg/min | INTRAVENOUS | Status: DC

## 2019-02-20 MED ORDER — CEFAZOLIN SODIUM-DEXTROSE 2-3 GM-%(50ML) IV SOLR
INTRAVENOUS | Status: DC | PRN
  Administered 2019-02-20: 2 g via INTRAVENOUS

## 2019-02-20 MED ORDER — BISACODYL 5 MG PO TBEC: 5.0000 mg | DELAYED_RELEASE_TABLET | Freq: Every day | ORAL | Status: DC | PRN

## 2019-02-20 MED ORDER — PROPOFOL 10 MG/ML IV BOLUS
INTRAVENOUS | Status: AC
  Filled 2019-02-20: qty 20

## 2019-02-20 MED ORDER — SODIUM BICARBONATE 650 MG PO TABS
1300.0000 mg | ORAL_TABLET | Freq: Two times a day (BID) | ORAL | Status: DC
  Administered 2019-02-20 – 2019-02-23 (×6): 1300 mg via ORAL
  Filled 2019-02-20 (×6): qty 2

## 2019-02-20 MED ORDER — HYDROMORPHONE HCL 1 MG/ML IJ SOLN
0.5000 mg | INTRAMUSCULAR | Status: DC | PRN
  Administered 2019-02-21 – 2019-02-22 (×2): 1 mg via INTRAVENOUS
  Filled 2019-02-20 (×2): qty 1

## 2019-02-20 MED ORDER — ALUM & MAG HYDROXIDE-SIMETH 200-200-20 MG/5ML PO SUSP: 15.0000 mL | ORAL | Status: DC | PRN

## 2019-02-20 MED ORDER — CEFAZOLIN SODIUM-DEXTROSE 2-4 GM/100ML-% IV SOLN
2.0000 g | Freq: Three times a day (TID) | INTRAVENOUS | Status: AC
  Administered 2019-02-20 – 2019-02-21 (×2): 2 g via INTRAVENOUS
  Filled 2019-02-20 (×3): qty 100

## 2019-02-20 MED ORDER — DOPAMINE-DEXTROSE 3.2-5 MG/ML-% IV SOLN
INTRAVENOUS | Status: AC
  Filled 2019-02-20: qty 250

## 2019-02-20 MED ORDER — MAGNESIUM SULFATE 2 GM/50ML IV SOLN: 2.0000 g | Freq: Every day | INTRAVENOUS | Status: DC | PRN

## 2019-02-20 MED ORDER — EPHEDRINE SULFATE-NACL 50-0.9 MG/10ML-% IV SOSY
PREFILLED_SYRINGE | INTRAVENOUS | Status: DC | PRN
  Administered 2019-02-20: 10 mg via INTRAVENOUS

## 2019-02-20 MED ORDER — ASPIRIN EC 81 MG PO TBEC
81.0000 mg | DELAYED_RELEASE_TABLET | Freq: Every day | ORAL | Status: DC
  Administered 2019-02-21 – 2019-02-23 (×3): 81 mg via ORAL
  Filled 2019-02-20 (×4): qty 1

## 2019-02-20 MED ORDER — LACTATED RINGERS IV SOLN
INTRAVENOUS | Status: DC
  Administered 2019-02-20 (×2): via INTRAVENOUS

## 2019-02-20 MED ORDER — PHENOL 1.4 % MT LIQD: 1.0000 | OROMUCOSAL | Status: DC | PRN

## 2019-02-20 MED ORDER — LIDOCAINE 2% (20 MG/ML) 5 ML SYRINGE
INTRAMUSCULAR | Status: DC | PRN
  Administered 2019-02-20: 60 mg via INTRAVENOUS

## 2019-02-20 MED ORDER — ROCURONIUM BROMIDE 10 MG/ML (PF) SYRINGE
PREFILLED_SYRINGE | INTRAVENOUS | Status: DC | PRN
  Administered 2019-02-20: 50 mg via INTRAVENOUS

## 2019-02-20 MED ORDER — GUAIFENESIN-DM 100-10 MG/5ML PO SYRP: 15.0000 mL | ORAL_SOLUTION | ORAL | Status: DC | PRN

## 2019-02-20 MED ORDER — NEBIVOLOL HCL 10 MG PO TABS
5.0000 mg | ORAL_TABLET | Freq: Every day | ORAL | Status: DC
  Administered 2019-02-21 – 2019-02-23 (×2): 5 mg via ORAL
  Filled 2019-02-20 (×3): qty 1

## 2019-02-20 MED ORDER — CLOPIDOGREL BISULFATE 75 MG PO TABS
75.0000 mg | ORAL_TABLET | Freq: Every day | ORAL | Status: DC
  Administered 2019-02-21 – 2019-02-23 (×3): 75 mg via ORAL
  Filled 2019-02-20 (×3): qty 1

## 2019-02-20 SURGICAL SUPPLY — 47 items
CANISTER SUCT 3000ML PPV (MISCELLANEOUS) ×4 IMPLANT
CANNULA VESSEL 3MM 2 BLNT TIP (CANNULA) ×8 IMPLANT
CATH ROBINSON RED A/P 18FR (CATHETERS) ×4 IMPLANT
CLIP LIGATING EXTRA MED SLVR (CLIP) ×4 IMPLANT
CLIP LIGATING EXTRA SM BLUE (MISCELLANEOUS) ×4 IMPLANT
COVER WAND RF STERILE (DRAPES) IMPLANT
DECANTER SPIKE VIAL GLASS SM (MISCELLANEOUS) IMPLANT
DERMABOND ADVANCED (GAUZE/BANDAGES/DRESSINGS) ×2
DERMABOND ADVANCED .7 DNX12 (GAUZE/BANDAGES/DRESSINGS) ×2 IMPLANT
DRAIN HEMOVAC 1/8 X 5 (WOUND CARE) IMPLANT
ELECT REM PT RETURN 9FT ADLT (ELECTROSURGICAL) ×4
ELECTRODE REM PT RTRN 9FT ADLT (ELECTROSURGICAL) ×2 IMPLANT
EVACUATOR SILICONE 100CC (DRAIN) IMPLANT
GLOVE BIO SURGEON STRL SZ 6.5 (GLOVE) ×3 IMPLANT
GLOVE BIO SURGEONS STRL SZ 6.5 (GLOVE) ×1
GLOVE BIOGEL PI IND STRL 6.5 (GLOVE) ×6 IMPLANT
GLOVE BIOGEL PI IND STRL 7.5 (GLOVE) ×2 IMPLANT
GLOVE BIOGEL PI INDICATOR 6.5 (GLOVE) ×6
GLOVE BIOGEL PI INDICATOR 7.5 (GLOVE) ×2
GLOVE ECLIPSE 7.0 STRL STRAW (GLOVE) ×4 IMPLANT
GLOVE SS BIOGEL STRL SZ 7.5 (GLOVE) ×2 IMPLANT
GLOVE SUPERSENSE BIOGEL SZ 7.5 (GLOVE) ×2
GLOVE SURG SS PI 6.5 STRL IVOR (GLOVE) ×4 IMPLANT
GOWN STRL NON-REIN LRG LVL3 (GOWN DISPOSABLE) ×4 IMPLANT
GOWN STRL REUS W/ TWL LRG LVL3 (GOWN DISPOSABLE) ×8 IMPLANT
GOWN STRL REUS W/TWL LRG LVL3 (GOWN DISPOSABLE) ×8
KIT BASIN OR (CUSTOM PROCEDURE TRAY) ×4 IMPLANT
KIT SHUNT ARGYLE CAROTID ART 6 (VASCULAR PRODUCTS) IMPLANT
KIT TURNOVER KIT B (KITS) ×4 IMPLANT
NEEDLE 22X1 1/2 (OR ONLY) (NEEDLE) IMPLANT
NS IRRIG 1000ML POUR BTL (IV SOLUTION) ×8 IMPLANT
PACK CAROTID (CUSTOM PROCEDURE TRAY) ×4 IMPLANT
PAD ARMBOARD 7.5X6 YLW CONV (MISCELLANEOUS) ×8 IMPLANT
PATCH HEMASHIELD 8X75 (Vascular Products) ×4 IMPLANT
POSITIONER HEAD DONUT 9IN (MISCELLANEOUS) ×4 IMPLANT
SHUNT CAROTID BYPASS 10 (VASCULAR PRODUCTS) ×4 IMPLANT
SHUNT CAROTID BYPASS 12FRX15.5 (VASCULAR PRODUCTS) IMPLANT
SUT ETHILON 3 0 PS 1 (SUTURE) IMPLANT
SUT PROLENE 6 0 CC (SUTURE) ×12 IMPLANT
SUT SILK 3 0 (SUTURE)
SUT SILK 3-0 18XBRD TIE 12 (SUTURE) IMPLANT
SUT VIC AB 3-0 SH 27 (SUTURE) ×4
SUT VIC AB 3-0 SH 27X BRD (SUTURE) ×4 IMPLANT
SUT VICRYL 4-0 PS2 18IN ABS (SUTURE) ×4 IMPLANT
SYR CONTROL 10ML LL (SYRINGE) IMPLANT
TOWEL GREEN STERILE (TOWEL DISPOSABLE) ×4 IMPLANT
WATER STERILE IRR 1000ML POUR (IV SOLUTION) ×4 IMPLANT

## 2019-02-20 SURGICAL SUPPLY — 51 items
BANDAGE ESMARK 6X9 LF (GAUZE/BANDAGES/DRESSINGS) IMPLANT
BNDG ESMARK 6X9 LF (GAUZE/BANDAGES/DRESSINGS)
CANISTER SUCT 3000ML PPV (MISCELLANEOUS) ×3 IMPLANT
CANNULA VESSEL 3MM 2 BLNT TIP (CANNULA) ×3 IMPLANT
CATH EMB 3FR 80CM (CATHETERS) IMPLANT
CATH EMB 4FR 80CM (CATHETERS) IMPLANT
CATH EMB 5FR 80CM (CATHETERS) IMPLANT
CLIP LIGATING EXTRA MED SLVR (CLIP) ×3 IMPLANT
CLIP LIGATING EXTRA SM BLUE (MISCELLANEOUS) ×3 IMPLANT
COVER WAND RF STERILE (DRAPES) IMPLANT
CUFF TOURN SGL QUICK 34 (TOURNIQUET CUFF)
CUFF TOURN SGL QUICK 42 (TOURNIQUET CUFF) IMPLANT
CUFF TRNQT CYL 34X4.125X (TOURNIQUET CUFF) IMPLANT
DERMABOND ADVANCED (GAUZE/BANDAGES/DRESSINGS) ×2
DERMABOND ADVANCED .7 DNX12 (GAUZE/BANDAGES/DRESSINGS) ×1 IMPLANT
DRAIN CHANNEL 15F RND FF W/TCR (WOUND CARE) ×3 IMPLANT
DRAIN SNY 10X20 3/4 PERF (WOUND CARE) IMPLANT
DRAPE X-RAY CASS 24X20 (DRAPES) IMPLANT
ELECT CAUTERY BLADE 6.4 (BLADE) ×3 IMPLANT
ELECT REM PT RETURN 9FT ADLT (ELECTROSURGICAL) ×3
ELECTRODE REM PT RTRN 9FT ADLT (ELECTROSURGICAL) ×1 IMPLANT
EVACUATOR SILICONE 100CC (DRAIN) ×3 IMPLANT
GAUZE SPONGE 4X4 12PLY STRL LF (GAUZE/BANDAGES/DRESSINGS) ×3 IMPLANT
GLOVE SS BIOGEL STRL SZ 7.5 (GLOVE) ×1 IMPLANT
GLOVE SUPERSENSE BIOGEL SZ 7.5 (GLOVE) ×2
GLOVE SURG SS PI 6.5 STRL IVOR (GLOVE) ×3 IMPLANT
GOWN STRL REUS W/ TWL LRG LVL3 (GOWN DISPOSABLE) ×3 IMPLANT
GOWN STRL REUS W/TWL LRG LVL3 (GOWN DISPOSABLE) ×6
KIT BASIN OR (CUSTOM PROCEDURE TRAY) ×3 IMPLANT
KIT TURNOVER KIT B (KITS) ×3 IMPLANT
NS IRRIG 1000ML POUR BTL (IV SOLUTION) ×6 IMPLANT
PACK PERIPHERAL VASCULAR (CUSTOM PROCEDURE TRAY) ×3 IMPLANT
PAD ARMBOARD 7.5X6 YLW CONV (MISCELLANEOUS) ×6 IMPLANT
PADDING CAST COTTON 6X4 STRL (CAST SUPPLIES) IMPLANT
PENCIL BUTTON HOLSTER BLD 10FT (ELECTRODE) ×3 IMPLANT
SET COLLECT BLD 21X3/4 12 (NEEDLE) IMPLANT
STAPLER VISISTAT 35W (STAPLE) IMPLANT
STOPCOCK 4 WAY LG BORE MALE ST (IV SETS) IMPLANT
SUT ETHILON 3 0 PS 1 (SUTURE) ×3 IMPLANT
SUT PROLENE 5 0 C 1 24 (SUTURE) ×3 IMPLANT
SUT PROLENE 6 0 CC (SUTURE) ×3 IMPLANT
SUT VIC AB 2-0 CTX 36 (SUTURE) ×3 IMPLANT
SUT VIC AB 3-0 SH 27 (SUTURE) ×2
SUT VIC AB 3-0 SH 27X BRD (SUTURE) ×1 IMPLANT
SYR 3ML LL SCALE MARK (SYRINGE) ×3 IMPLANT
TAPE CLOTH SURG 4X10 WHT LF (GAUZE/BANDAGES/DRESSINGS) ×3 IMPLANT
TOWEL GREEN STERILE (TOWEL DISPOSABLE) ×3 IMPLANT
TRAY FOLEY MTR SLVR 16FR STAT (SET/KITS/TRAYS/PACK) ×3 IMPLANT
TUBING EXTENTION W/L.L. (IV SETS) IMPLANT
UNDERPAD 30X30 (UNDERPADS AND DIAPERS) ×3 IMPLANT
WATER STERILE IRR 1000ML POUR (IV SOLUTION) ×3 IMPLANT

## 2019-02-20 NOTE — Op Note (Signed)
    OPERATIVE REPORT  DATE OF SURGERY: 02/20/2019  PATIENT: Allen Schultz, 68 y.o. male MRN: [id number]  DOB: 1951-04-14  PRE-OPERATIVE DIAGNOSIS: Left neck hematoma post left carotid endarterectomy  POST-OPERATIVE DIAGNOSIS:  Same  PROCEDURE: Evacuation of hematoma and placement of 15 Blake drain  SURGEON:  Curt Jews, M.D.  PHYSICIAN ASSISTANT: Nurse  ANESTHESIA: General  EBL: per anesthesia record  Total I/O In: 2700 [I.V.:2700] Out: 100 [Blood:100]  BLOOD ADMINISTERED: none  DRAINS: none  SPECIMEN: none  COUNTS CORRECT:  YES  PATIENT DISPOSITION:  PACU - hemodynamically stable  PROCEDURE DETAILS: Patient status post left carotid endarterectomy earlier in the day for symptomatic carotid disease.  He developed hematoma in his left neck while in recovery room.  This was not rapidly expansive.  We watched this for several hours and he continued to have some continued swelling.  He did not have any airway issues.  It was felt safest to proceed back to the operating room for evacuation.  Of note he did have low platelet count and had a prior liver transplant with some underlying coagulability.  The left neck incision was reopened after prepping and draping in the usual sterile fashion.  There was a moderate hematoma below the level of the sternocleidomastoid muscle.  This was all evacuated.  The carotid endarterectomy patch was visualized and there was no bleeding from the endarterectomy patch.  The wound was irrigated and hemostasis drainage cautery.  The patient had a 13 Blake drain placed through a separate stab incision and the drain was placed in the base of the wound.  The wound was closed with 3-0 Vicryl to close the platysma in a running fashion.  Skin was closed with 4-0 subcuticular Vicryl stitch.  Sterile dressing was applied and the patient was transferred to the recovery in stable condition   Allen Schultz, M.D., [facility] 02/20/2019 4:28 PM

## 2019-02-20 NOTE — Interval H&P Note (Signed)
History and Physical Interval Note:  02/20/2019 7:51 AM  Allen Schultz  has presented today for surgery, with the diagnosis of LEFT CAROTID ARTERY STENOSIS.  The various methods of treatment have been discussed with the patient and family. After consideration of risks, benefits and other options for treatment, the patient has consented to  Procedure(s): ENDARTERECTOMY CAROTID LEFT (Left) as a surgical intervention.  The patient's history has been reviewed, patient examined, no change in status, stable for surgery.  I have reviewed the patient's chart and labs.  Questions were answered to the patient's satisfaction.     Curt Jews

## 2019-02-20 NOTE — Transfer of Care (Signed)
Immediate Anesthesia Transfer of Care Note  Patient: Pecolia Ades  Procedure(s) Performed: Evacuation of hematoma, exploration of carotid (Left Neck)  Patient Location: PACU  Anesthesia Type:General  Level of Consciousness: awake, alert  and oriented  Airway & Oxygen Therapy: Patient Spontanous Breathing and Patient connected to nasal cannula oxygen  Post-op Assessment: Report given to RN and Post -op Vital signs reviewed and stable  Post vital signs: Reviewed and stable  Last Vitals:  Vitals Value Taken Time  BP 109/46 02/20/19 1438  Temp 36.3 C 02/20/19 1435  Pulse 67 02/20/19 1439  Resp 24 02/20/19 1439  SpO2 98 % 02/20/19 1439  Vitals shown include unvalidated device data.  Last Pain:  Vitals:   02/20/19 1251  TempSrc:   PainSc: 9       Patients Stated Pain Goal: 3 ([id number])  Complications: No apparent anesthesia complications

## 2019-02-20 NOTE — Op Note (Signed)
° °  OPERATIVE REPORT  DATE OF SURGERY: 02/20/2019  PATIENT: Allen Schultz, 68 y.o. male MRN: [id number]  DOB: September 28, 1950  PRE-OPERATIVE DIAGNOSIS: Left Carotid Stenosis, Symptomatic  POST-OPERATIVE DIAGNOSIS:  Same  PROCEDURE:  Left Carotid Endarterectomy with Dacron Patch Angioplasty  SURGEON:  Curt Jews, M.D.  PHYSICIAN ASSISTANT: Collins  ANESTHESIA:   general  EBL: Less than 200 ml  Total I/O In: 2200 [I.V.:2200] Out: 50 [Blood:50]  BLOOD ADMINISTERED: none  DRAINS: none   SPECIMEN: none  COUNTS CORRECT:  YES  PLAN OF CARE: Admit to inpatient   PATIENT DISPOSITION:  PACU - hemodynamically stable and neurologically intact.  PROCEDURE DETAILS: The patient was taken to the operating room placed in supine position.  General anesthesia was administered.  The neck was prepped and draped in the usual sterile fashion.  An incision was made anterior to the sternocleidomastoid and carried down through the platysma with electrocautery.  The sternocleidomastoid was reflected posteriorly and the carotid sheath was opened.  The facial vein was ligated with 2-0 silk ties and divided.  The common carotid artery was encircled with an umbilical tape and Rummel tourniquet.  The vagus nerve was identified and preserved.  Dissection was continued onto the carotid bifurcation.  The superior thyroid artery was encircled with a 2-0 silk Potts tie.  The external carotid was encircled with a blue vessel loop and the internal carotid was encircled with an umbilical tape and Rummel tourniquet.  The hypoglossal nerve was identified and preserved.  The patient was given systemic heparin and after adequate circulation time, the internal, external and common carotid arteries were occluded with vascular clamps.  The common carotid artery was opened with an 11 blade and extended  longitudinally with Potts scissors.  A 10 shunt was passed up the internal carotid and allowed to backbleed.  It was then  passed down the common carotid where it was secured with Rummel tourniquet.  The endarterectomy was begun on the common carotid artery and the plaque was divided proximally with Potts scissors.  The endarterectomy was continued onto the bifurcation.  The external carotid was endarterectomized with an eversion technique and the internal carotid was endarterectomized in an open fashion.  Remaining atheromatous debris was removed from the endarterectomy plane.  A Finesse Hemashield Dacron patch was brought onto the field and was sewn as a patch angioplasty with a running 6-0 Prolene suture.  Prior to completion of the closure the shunt was removed and the usual flushing maneuvers were undertaken.  The anastomosis was completed and flow was restored first to the external and then the internal carotid artery.  Excellent flow characteristics were noted with hand-held Doppler in the internal and external carotid arteries.  The patient was given 50 mg of protamine to reverse the heparin.  The wounds were irrigated with saline.  Hemostasis was obtained with electrocautery.  The wounds were closed with 3-0 Vicryl to reapproximate the sternocleidomastoid over the carotid sheath.  The platysma was lysed with a running 3-0 Vicryl suture.  The skin was closed with a 4-0 subcuticular Vicryl stitch.  Dermabond was applied.  The patient was awakened neurologically intact in the operating room and transferred to the recovery room in stable condition   Curt Jews, M.D. 02/20/2019 1:28 PM

## 2019-02-20 NOTE — Transfer of Care (Signed)
Immediate Anesthesia Transfer of Care Note  Patient: Allen Schultz  Procedure(s) Performed: ENDARTERECTOMY CAROTID LEFT (Left ) PATCH ANGIOPLASTY USING HEMASHIELD PLATINUM FINESSE 0.3in X 3in (Left Neck)  Patient Location: PACU  Anesthesia Type:General  Level of Consciousness: awake, alert  and oriented  Airway & Oxygen Therapy: Patient Spontanous Breathing  Post-op Assessment: Report given to RN, Post -op Vital signs reviewed and stable, Patient moving all extremities X 4 and Patient able to stick tongue midline  Post vital signs: Reviewed and stable  Last Vitals:  Vitals Value Taken Time  BP    Temp    Pulse    Resp    SpO2      Last Pain:  Vitals:   02/20/19 0810  TempSrc:   PainSc: 0-No pain      Patients Stated Pain Goal: 3 ([id number])  Complications: No apparent anesthesia complications

## 2019-02-20 NOTE — Discharge Instructions (Signed)
   [facility]  Discharge Instructions   Carotid Endarterectomy (CEA)  Please refer to the following instructions for your post-procedure care. Your surgeon or physician assistant will discuss any changes with you.  Activity  You are encouraged to walk as much as you can. You can slowly return to normal activities but must avoid strenuous activity and heavy lifting until your doctor tell you it's OK. Avoid activities such as vacuuming or swinging a golf club. You can drive after one week if you are comfortable and you are no longer taking prescription pain medications. It is normal to feel tired for serval weeks after your surgery. It is also normal to have difficulty with sleep habits, eating, and bowel movements after surgery. These will go away with time.  Bathing/Showering  You may shower after you come home. Do not soak in a bathtub, hot tub, or swim until the incision heals completely.  Incision Care  Shower every day. Clean your incision with mild soap and water. Pat the area dry with a clean towel. You do not need a bandage unless otherwise instructed. Do not apply any ointments or creams to your incision. You may have skin glue on your incision. Do not peel it off. It will come off on its own in about one week. Your incision may feel thickened and raised for several weeks after your surgery. This is normal and the skin will soften over time. For Men Only: It's OK to shave around the incision but do not shave the incision itself for 2 weeks. It is common to have numbness under your chin that could last for several months.  Diet  Resume your normal diet. There are no special food restrictions following this procedure. A low fat/low cholesterol diet is recommended for all patients with vascular disease. In order to heal from your surgery, it is CRITICAL to get adequate nutrition. Your body requires vitamins, minerals, and protein. Vegetables are the best  source of vitamins and minerals. Vegetables also provide the perfect balance of protein. Processed food has little nutritional value, so try to avoid this.        Medications  Resume taking all of your medications unless your doctor or physician assistant tells you not to. If your incision is causing pain, you may take over-the- counter pain relievers such as acetaminophen (Tylenol). If you were prescribed a stronger pain medication, please be aware these medications can cause nausea and constipation. Prevent nausea by taking the medication with a snack or meal. Avoid constipation by drinking plenty of fluids and eating foods with a high amount of fiber, such as fruits, vegetables, and grains. Do not take Tylenol if you are taking prescription pain medications.  Follow Up  Our office will schedule a follow up appointment 2-3 weeks following discharge.  Please call us immediately for any of the following conditions  Increased pain, redness, drainage (pus) from your incision site. Fever of 101 degrees or higher. If you should develop stroke (slurred speech, difficulty swallowing, weakness on one side of your body, loss of vision) you should call 911 and go to the nearest emergency room.  Reduce your risk of vascular disease:  Stop smoking. If you would like help call [facility] at 1-800-QUIT-NOW (1-800-784-8669) or [place] at 336-586-4000. Manage your cholesterol Maintain a desired weight Control your diabetes Keep your blood pressure down  If you have any questions, please call the office at [phone].   

## 2019-02-20 NOTE — Anesthesia Postprocedure Evaluation (Signed)
Anesthesia Post Note  Patient: Stanberry LUTH  Procedure(s) Performed: ENDARTERECTOMY CAROTID LEFT (Left ) PATCH ANGIOPLASTY USING HEMASHIELD PLATINUM FINESSE 0.3in X 3in (Left Neck)     Patient location during evaluation: PACU Anesthesia Type: General Level of consciousness: awake and alert Pain management: pain level controlled Vital Signs Assessment: post-procedure vital signs reviewed and stable Respiratory status: spontaneous breathing, nonlabored ventilation, respiratory function stable and patient connected to nasal cannula oxygen Cardiovascular status: blood pressure returned to baseline and stable Postop Assessment: no apparent nausea or vomiting Anesthetic complications: no    Last Vitals:  Vitals:   02/20/19 1450 02/20/19 1505  BP: (!) 76/45   Pulse: 66 (!) 58  Resp: 17 15  Temp:    SpO2: 99% 97%    Last Pain:  Vitals:   02/20/19 1435  TempSrc:   PainSc: 0-No pain                 Norena Bratton DAVID

## 2019-02-20 NOTE — Anesthesia Procedure Notes (Signed)
Procedure Name: Intubation Date/Time: 02/20/2019 1:41 PM Performed by: Alain Marion, CRNA Pre-anesthesia Checklist: Patient identified, Emergency Drugs available, Suction available and Patient being monitored Patient Re-evaluated:Patient Re-evaluated prior to induction Oxygen Delivery Method: Circle System Utilized Preoxygenation: Pre-oxygenation with 100% oxygen Induction Type: IV induction and Rapid sequence Ventilation: Mask ventilation without difficulty Laryngoscope Size: Miller and 2 Grade View: Grade I Tube type: Oral Tube size: 7.5 mm Number of attempts: 1 Airway Equipment and Method: Stylet and Oral airway Placement Confirmation: ETT inserted through vocal cords under direct vision,  positive ETCO2 and breath sounds checked- equal and bilateral Secured at: 22 cm Tube secured with: Tape Dental Injury: Teeth and Oropharynx as per pre-operative assessment

## 2019-02-20 NOTE — Anesthesia Preprocedure Evaluation (Signed)
Anesthesia Evaluation  Patient identified by MRN, date of birth, ID band Patient awake    Reviewed: Allergy & Precautions, NPO status , Patient's Chart, lab work & pertinent test results  History of Anesthesia Complications (+) PONV  Airway Mallampati: I  TM Distance: >3 FB Neck ROM: Full    Dental   Pulmonary former smoker,    Pulmonary exam normal        Cardiovascular hypertension, Pt. on medications Normal cardiovascular exam     Neuro/Psych    GI/Hepatic GERD  Medicated and Controlled,S/P Liver transplant 1996   Endo/Other  diabetes, Type 2, Insulin Dependent  Renal/GU Renal InsufficiencyRenal disease     Musculoskeletal   Abdominal   Peds  Hematology   Anesthesia Other Findings   Reproductive/Obstetrics                             Anesthesia Physical Anesthesia Plan  ASA: III and emergent  Anesthesia Plan: General   Post-op Pain Management:    Induction: Intravenous  PONV Risk Score and Plan: 3 and Ondansetron, Midazolam and Treatment may vary due to age or medical condition  Airway Management Planned: Oral ETT  Additional Equipment:   Intra-op Plan:   Post-operative Plan: Extubation in OR  Informed Consent: I have reviewed the patients History and Physical, chart, labs and discussed the procedure including the risks, benefits and alternatives for the proposed anesthesia with the patient or authorized representative who has indicated his/her understanding and acceptance.       Plan Discussed with: CRNA and Surgeon  Anesthesia Plan Comments: (Hematoma of the neck -> to OR)        Anesthesia Quick Evaluation

## 2019-02-20 NOTE — Progress Notes (Signed)
Patient ID: Allen Schultz, male   DOB: 11-27-1950, 68 y.o.   MRN: [id number] The patient remains hemodynamically stable in PACU.  He has had some progression of the moderate hematoma and has a left neck.  He is neurologically intact.  I discussed options with the patient.  I have recommended return to the operating room to evacuate this hematoma and place a drain.  Concerned that he may have progression later in the day and this could become emergent.  He understands.  I discussed this with his brother by telephone as well.  Will return to the operating room for hematoma evacuation

## 2019-02-20 NOTE — Anesthesia Procedure Notes (Signed)
Arterial Line Insertion Start/End10/28/2020 8:45 AM, 02/20/2019 8:50 AM Performed by: Kyung Rudd, CRNA, CRNA  Patient location: OOR procedure area. Preanesthetic checklist: patient identified, IV checked, risks and benefits discussed, surgical consent, monitors and equipment checked and pre-op evaluation Right, radial was placed Catheter size: 20 G Hand hygiene performed  and maximum sterile barriers used   Attempts: 1 Procedure performed without using ultrasound guided technique. Ultrasound Notes:anatomy identified Following insertion, Biopatch and dressing applied.

## 2019-02-20 NOTE — Anesthesia Procedure Notes (Signed)
Procedure Name: Intubation Date/Time: 02/20/2019 9:25 AM Performed by: Babs Bertin, CRNA Pre-anesthesia Checklist: Patient identified, Emergency Drugs available, Suction available and Patient being monitored Patient Re-evaluated:Patient Re-evaluated prior to induction Oxygen Delivery Method: Circle System Utilized Preoxygenation: Pre-oxygenation with 100% oxygen Induction Type: IV induction Ventilation: Mask ventilation without difficulty Laryngoscope Size: Mac and 3 Grade View: Grade II Tube type: Oral Tube size: 7.5 mm Number of attempts: 1 Airway Equipment and Method: Stylet,  Oral airway and LTA kit utilized Placement Confirmation: ETT inserted through vocal cords under direct vision,  positive ETCO2 and breath sounds checked- equal and bilateral Secured at: 22 cm Tube secured with: Tape Dental Injury: Teeth and Oropharynx as per pre-operative assessment

## 2019-02-20 NOTE — Anesthesia Postprocedure Evaluation (Signed)
Anesthesia Post Note  Patient: Allen Schultz  Procedure(s) Performed: Evacuation of hematoma, exploration of carotid (Left Neck)     Patient location during evaluation: PACU Anesthesia Type: General Level of consciousness: awake and alert Pain management: pain level controlled Vital Signs Assessment: post-procedure vital signs reviewed and stable Respiratory status: spontaneous breathing, nonlabored ventilation, respiratory function stable and patient connected to nasal cannula oxygen Cardiovascular status: blood pressure returned to baseline and stable Postop Assessment: no apparent nausea or vomiting Anesthetic complications: no    Last Vitals:  Vitals:   02/20/19 1550 02/20/19 1605  BP: (!) 86/41 (!) 90/40  Pulse: 60 (!) 50  Resp: 13 18  Temp:    SpO2: 98% 97%    Last Pain:  Vitals:   02/20/19 1605  TempSrc:   PainSc: 6     LLE Motor Response: Purposeful movement (02/20/19 1605) LLE Sensation: Full sensation (02/20/19 1605) RLE Motor Response: Purposeful movement (02/20/19 1605) RLE Sensation: Full sensation (02/20/19 1605)      Asmaa Tirpak DAVID

## 2019-02-21 ENCOUNTER — Other Ambulatory Visit: Payer: Self-pay | Admitting: *Deleted

## 2019-02-21 ENCOUNTER — Encounter (HOSPITAL_COMMUNITY): Payer: Self-pay | Admitting: Vascular Surgery

## 2019-02-21 LAB — CBC
HCT: 33.9 % — ABNORMAL LOW (ref 39.0–52.0)
Hemoglobin: 10.9 g/dL — ABNORMAL LOW (ref 13.0–17.0)
MCH: 31.5 pg (ref 26.0–34.0)
MCHC: 32.2 g/dL (ref 30.0–36.0)
MCV: 98 fL (ref 80.0–100.0)
Platelets: 98 10*3/uL — ABNORMAL LOW (ref 150–400)
RBC: 3.46 MIL/uL — ABNORMAL LOW (ref 4.22–5.81)
RDW: 15.1 % (ref 11.5–15.5)
WBC: 8.9 10*3/uL (ref 4.0–10.5)
nRBC: 0 % (ref 0.0–0.2)

## 2019-02-21 LAB — BASIC METABOLIC PANEL
Anion gap: 8 (ref 5–15)
BUN: 20 mg/dL (ref 8–23)
CO2: 21 mmol/L — ABNORMAL LOW (ref 22–32)
Calcium: 7.8 mg/dL — ABNORMAL LOW (ref 8.9–10.3)
Chloride: 105 mmol/L (ref 98–111)
Creatinine, Ser: 1.55 mg/dL — ABNORMAL HIGH (ref 0.61–1.24)
GFR calc Af Amer: 53 mL/min — ABNORMAL LOW (ref >60)
GFR calc non Af Amer: 46 mL/min — ABNORMAL LOW (ref >60)
Glucose, Bld: 142 mg/dL — ABNORMAL HIGH (ref 70–99)
Potassium: 4.5 mmol/L (ref 3.5–5.1)
Sodium: 134 mmol/L — ABNORMAL LOW (ref 135–145)

## 2019-02-21 LAB — GLUCOSE, CAPILLARY
Glucose-Capillary: 137 mg/dL — ABNORMAL HIGH (ref 70–99)
Glucose-Capillary: 148 mg/dL — ABNORMAL HIGH (ref 70–99)
Glucose-Capillary: 119 mg/dL — ABNORMAL HIGH (ref 70–99)
Glucose-Capillary: 136 mg/dL — ABNORMAL HIGH (ref 70–99)

## 2019-02-21 MED ORDER — SODIUM CHLORIDE 0.9 % BOLUS PEDS
500.0000 mL | Freq: Once | INTRAVENOUS | Status: AC
  Administered 2019-02-21: 09:00:00 500 mL via INTRAVENOUS

## 2019-02-21 MED ORDER — DOPAMINE-DEXTROSE 3.2-5 MG/ML-% IV SOLN: 5.0000 ug/kg/min | INTRAVENOUS | Status: DC

## 2019-02-21 NOTE — Progress Notes (Addendum)
  Progress Note    02/21/2019 9:21 AM 1 Day Post-Op  Subjective:  No stroke like symptoms overnight or this morning.  Still requiring some dobutamine   Vitals:   02/21/19 0815 02/21/19 0830  BP:    Pulse: (!) 59 66  Resp: 15 16  Temp:    SpO2: 92% 90%   Physical Exam: Lungs:  Non labored Incisions:  L neck incision c/d/i, no hematoma; 65cc sanguinous drainage JP output Extremities: moving all extremities well Neurologic: CN grossly intact  CBC    Component Value Date/Time   WBC 8.9 02/21/2019 0604   RBC 3.46 (L) 02/21/2019 0604   HGB 10.9 (L) 02/21/2019 0604   HCT 33.9 (L) 02/21/2019 0604   PLT 98 (L) 02/21/2019 0604   MCV 98.0 02/21/2019 0604   MCH 31.5 02/21/2019 0604   MCHC 32.2 02/21/2019 0604   RDW 15.1 02/21/2019 0604   LYMPHSABS 1.9 10/17/2016 0521   MONOABS 0.6 10/17/2016 0521   EOSABS 0.4 10/17/2016 0521   BASOSABS 0.0 10/17/2016 0521    BMET    Component Value Date/Time   NA 134 (L) 02/21/2019 0604   K 4.5 02/21/2019 0604   CL 105 02/21/2019 0604   CO2 21 (L) 02/21/2019 0604   GLUCOSE 142 (H) 02/21/2019 0604   BUN 20 02/21/2019 0604   CREATININE 1.55 (H) 02/21/2019 0604   CALCIUM 7.8 (L) 02/21/2019 0604   GFRNONAA 46 (L) 02/21/2019 0604   GFRAA 53 (L) 02/21/2019 0604    INR    Component Value Date/Time   INR 1.3 (H) 02/18/2019 0952     Intake/Output Summary (Last 24 hours) at 02/21/2019 [id number] Last data filed at 02/21/2019 0800 Gross per 24 hour  Intake 5324.56 ml  Output 475 ml  Net 4849.56 ml     Assessment/Plan:  68 y.o. male is s/p L CEA with subsequent hematoma drainage and JP placement 1 Day Post-Op   Neuro exam remains at baseline Hypotensive: still requiring some dobutamine; 500cc bolus; wean pressor as tolerated L neck JP: 65cc overnight continue drain Dispo: home when pressor support off, JP discontinued, and mobility improved   Dagoberto Ligas, PA-C [facility] [phone] 02/21/2019 9:21 AM   I have examined the patient, reviewed and agree with above.  Comfortable.  Neurologically intact.  Minimal swelling in his left neck.  And significant output from JP.  Will discontinue JP drain.  Still on low-dose dopamine.  We will continue to wean this and discharge later today if he is comfortable and blood pressure stable.  He was found to have blood pressure in the 0000000 systolic on presentation prior to surgery yesterday.  Curt Jews, MD 02/21/2019 10:09 AM

## 2019-02-21 NOTE — Patient Outreach (Signed)
[place] [facility]) Care Management  02/21/2019  Allen Schultz Jul 22, 1950 [id number]   Follow up call to [facility] stroke patient after procedure      Insurance: united healthcare medicare and medicaid of [place]   [facility] admissions x 1 ED visits x 1 in the last 6 months  Date of Admission: 02/05/2019 Date of Discharge: 02/07/2019   Outreach attempt unsuccessful to the home number and the mobile number  [facility] RN CM left HIPAA compliant voicemail messages along with CM's contact info.   Plan: [name] RN CM scheduled this patient for another call attempt within 7-10 business days  Teesha Ohm L. Lavina Hamman, RN, BSN, [place] Coordinator Office number [phone] Mobile number [phone]  Main [facility] number [phone] Fax number [phone]

## 2019-02-21 NOTE — Progress Notes (Signed)
PA-C Dagoberto Ligas made aware of SBP 90 with drop of Dopamine to 49mcg.  Wean if SBP 90>.  Also made aware of low urine output.  Order for BMP for in the morning to monitor creatinine

## 2019-02-21 NOTE — Progress Notes (Signed)
Chaplain provided conversation and prayer to "Allen Schultz."  During conversation, Allen Schultz noted that medical staff was trying to still figure out what was wrong with him.  [name] requested prayer over finding a diagnosis.    Chaplain will continue to follow-up.

## 2019-02-21 NOTE — Addendum Note (Signed)
Addendum  created 02/21/19 0847 by Sammie Bench, CRNA   Order list changed

## 2019-02-22 ENCOUNTER — Other Ambulatory Visit: Payer: Self-pay | Admitting: *Deleted

## 2019-02-22 LAB — BASIC METABOLIC PANEL
Anion gap: 7 (ref 5–15)
BUN: 21 mg/dL (ref 8–23)
CO2: 21 mmol/L — ABNORMAL LOW (ref 22–32)
Calcium: 7.7 mg/dL — ABNORMAL LOW (ref 8.9–10.3)
Chloride: 106 mmol/L (ref 98–111)
Creatinine, Ser: 1.22 mg/dL (ref 0.61–1.24)
GFR calc Af Amer: >60 mL/min (ref >60)
GFR calc non Af Amer: >60 mL/min (ref >60)
Glucose, Bld: 145 mg/dL — ABNORMAL HIGH (ref 70–99)
Potassium: 4.2 mmol/L (ref 3.5–5.1)
Sodium: 134 mmol/L — ABNORMAL LOW (ref 135–145)

## 2019-02-22 LAB — GLUCOSE, CAPILLARY
Glucose-Capillary: 135 mg/dL — ABNORMAL HIGH (ref 70–99)
Glucose-Capillary: 118 mg/dL — ABNORMAL HIGH (ref 70–99)
Glucose-Capillary: 117 mg/dL — ABNORMAL HIGH (ref 70–99)
Glucose-Capillary: 152 mg/dL — ABNORMAL HIGH (ref 70–99)

## 2019-02-22 NOTE — Progress Notes (Signed)
Patient transferred to 4E-02 with personal belonging left at the bedside.

## 2019-02-22 NOTE — Patient Outreach (Signed)
[name]) Care Management  02/22/2019  Allen Schultz 28-Feb-1951 [id number]   CSW was scheduled to make initial contact with patient today to perform phone assessment, as well as assess and assist with social work needs and services; however, CSW noted that patient remains hospitalized for operative procedure of Left Neck Hematoma; Left Carotid Endarterectomy.  CSW will continue to follow patient while hospitalized, then resume social work services once patient is discharged back into the community.  [name], BSW, MSW, LCSW  Licensed Education officer, environmental Health System  Mailing Page N. [address] [place], [place] [address] Physical Address-[address], [place], [place] [address] Toll Free Main # [phone] Fax # [phone] Cell # [phone]  Office # [phone] [email or web address]

## 2019-02-22 NOTE — Progress Notes (Signed)
   VASCULAR SURGERY ASSESSMENT & PLAN:   POD 2 Status post left carotid endarterectomy and subsequent evacuation of hematoma left neck.  Patient is doing well neurologically.  However he is still on 4 mcg/kg/min of dopamine and therefore is still in the ICU.  He will be ready for discharge once she is off dopamine.  SUBJECTIVE:   No specific complaints.  No problems swallowing and no hoarseness.  PHYSICAL EXAM:   Vitals:   02/22/19 0700 02/22/19 0809 02/22/19 1137 02/22/19 1529  BP: 90/61     Pulse: 62     Resp: 20     Temp:  98.4 F (36.9 C) 98.8 F (37.1 C) 98.5 F (36.9 C)  TempSrc:  Oral Oral Oral  SpO2: 95%     Weight:      Height:       Neuro intact. His left neck incision has some bruising but otherwise looks fine.  LABS:   Lab Results  Component Value Date   WBC 8.9 02/21/2019   HGB 10.9 (L) 02/21/2019   HCT 33.9 (L) 02/21/2019   MCV 98.0 02/21/2019   PLT 98 (L) 02/21/2019   Lab Results  Component Value Date   CREATININE 1.22 02/22/2019   Lab Results  Component Value Date   INR 1.3 (H) 02/18/2019   CBG (last 3)  Recent Labs    02/22/19 0643 02/22/19 1135 02/22/19 1531  GLUCAP 135* 118* 117*    PROBLEM LIST:    Active Problems:   Carotid stenosis   CURRENT MEDS:   . aspirin EC  81 mg Oral Daily  . Chlorhexidine Gluconate Cloth  6 each Topical Daily  . clopidogrel  75 mg Oral Daily  . docusate sodium  100 mg Oral Daily  . escitalopram  10 mg Oral Daily  . famotidine  20 mg Oral Daily  . gabapentin  300 mg Oral TID  . heparin  5,000 Units Subcutaneous Q8H  . insulin aspart  0-15 Units Subcutaneous TID WC  . loperamide  6 mg Oral BID  . mouth rinse  15 mL Mouth Rinse BID  . mycophenolate  360 mg Oral BID  . nebivolol  5 mg Oral Daily  . pantoprazole  40 mg Oral Daily  . pravastatin  80 mg Oral QHS  . sodium bicarbonate  1,300 mg Oral BID  . tamsulosin  0.4 mg Oral Daily  . Vitamin D (Ergocalciferol)  50,000 Units Oral Q7 days     Deitra Mayo Office: [phone] 02/22/2019

## 2019-02-23 LAB — GLUCOSE, CAPILLARY
Glucose-Capillary: 111 mg/dL — ABNORMAL HIGH (ref 70–99)
Glucose-Capillary: 145 mg/dL — ABNORMAL HIGH (ref 70–99)

## 2019-02-23 MED ORDER — TRAMADOL HCL 50 MG PO TABS: 50.0000 mg | ORAL_TABLET | Freq: Four times a day (QID) | ORAL | 0 refills | Status: AC | PRN

## 2019-02-23 NOTE — Progress Notes (Signed)
Discharge AVS meds take and those due reviewed with pt. Follow up appointments and when to call MD reviewed. All questions and concerns addressed. No further questions at this time. D/c IV and TELE, CCMD notified. D/C home per orders. Pt brought down via wheelchair Amanda Cockayne, RN

## 2019-02-23 NOTE — Discharge Summary (Signed)
Discharge Summary     Allen Schultz Nov 02, 1950 68 y.o. male  [id number]  Admission Date: 02/20/2019  Discharge Date: 02/23/19  Physician: Rosetta Posner, MD  Admission Diagnosis: Carotid stenosis [I65.29]  Discharge Day services:    Dopamine weaned off.  No stroke like symptoms including slurring speech, changes in vision, or one sided weakness.  L neck incision with local ecchymosis, minimal swelling.  Wants to go home. Physical Exam: Vitals:   02/22/19 2343 02/23/19 0819  BP: (!) 122/57 (!) 123/52  Pulse: 64 63  Resp: (!) 26 18  Temp: 98.6 F (37 C) 98 F (36.7 C)  SpO2: 100% 100%   Lungs:  Non labored Incisions:  L neck incision c/d/i, some edema, impressive ecchymosis Extremities:  Moving all extremities well Neurologic: CN grossly intact  Hospital Course:  The patient was admitted to the hospital and taken to the operating room on 02/20/2019 and underwent left carotid endarterectomy.  The pt tolerated the procedure well and was transported to the PACU in good condition.  In PACU, he was noted to have a hematoma at L neck incision.  He returned to the operating room for evacuation of hematoma and placement of JP drain.  He required dopamine and, because of this, was transferred to the ICU postoperatively. POD 1, JP was removed.  He was unable to be weaned from dopamine and was kept in ICU an additional day.  POD#2 he was weaned from pressor support and transferred to step down unit.  POD #3 he is stable for discharge home.  He will follow up with Dr. Donnetta Hutching in 2 weeks.  He will be prescribed 2-3 days of narcotic pain medication for continued post operative pain control.  He will be discharged home this morning in stable condition.   Recent Labs    02/21/19 0604 02/22/19 0632  NA 134* 134*  K 4.5 4.2  CL 105 106  CO2 21* 21*  GLUCOSE 142* 145*  BUN 20 21  CALCIUM 7.8* 7.7*   Recent Labs    02/20/19 1200 02/20/19 1358 02/21/19 0604  WBC 7.0  --  8.9   HGB 10.2* 10.5* 10.9*  HCT 31.7* 31.0* 33.9*  PLT 76*  --  98*   No results for input(s): INR in the last 72 hours.     Discharge Diagnosis:  Carotid stenosis [I65.29]  Secondary Diagnosis: Patient Active Problem List   Diagnosis Date Noted  . Carotid stenosis 02/20/2019  . Bilateral carotid artery stenosis 02/12/2019  . Cerebrovascular accident (CVA) ([place]) 02/05/2019  . Recurrent major depressive disorder, in partial remission ([name]) 10/25/2016  . Hypertension associated with diabetes ([place]) 10/24/2016  . Hypersomnolence 10/24/2016  . Heart murmur, systolic 99991111  . Immunosuppressed status ([place]) 10/15/2016  . CKD stage 3 due to type 2 diabetes mellitus ([place]) 10/15/2016  . Fever and chills 10/15/2016  . Bladder wall thickening 10/15/2016  . Thrombocytopenia ([place]) 10/15/2016  . Immunocompromised ([place])   . Pure hypercholesterolemia 06/26/2015  . Major depressive disorder 09/28/2014  . Gastro-esophageal reflux disease without esophagitis 09/28/2014  . Type 2 diabetes mellitus without complication, with long-term current use of insulin ([place]) 08/19/2013  . Malignant neoplasm of colon ([place]) 08/19/2013  . History of liver transplant ([place]) 07/04/2011   Past Medical History:  Diagnosis Date  . Acid reflux   . Anxiety   . Aortic stenosis   . 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 Chronic kidney disease   . Colon cancer ([place])   . Colon  polyp   . Depression   . Diabetes mellitus without complication ([place])   . Heart murmur   . High cholesterol   . Hypertension   . Liver transplant recipient [facility])   . Neuropathy   . PONV (postoperative nausea and vomiting)     Allergies as of 02/23/2019      Reactions   Clinoril [sulindac] Swelling   Lip swelling   Statins Other (See Comments)   Due to transplant      Medication List    TAKE these medications   aspirin 81 MG EC tablet Take 1 tablet (81 mg total) by mouth daily.   clopidogrel 75 MG tablet Commonly known as: PLAVIX Take 1  tablet (75 mg total) by mouth daily.   clotrimazole 1 % cream Commonly known as: LOTRIMIN Apply 1 application topically daily as needed (rash).   cyclobenzaprine 10 MG tablet Commonly known as: FLEXERIL Take 10 mg by mouth 3 (three) times daily as needed for muscle spasms.   escitalopram 10 MG tablet Commonly known as: LEXAPRO Take 10 mg by mouth daily.   famotidine 20 MG tablet Commonly known as: PEPCID Take 20 mg by mouth daily.   gabapentin 300 MG capsule Commonly known as: NEURONTIN Take 1 capsule (300 mg total) by mouth 3 (three) times daily.   insulin lispro protamine-lispro (75-25) 100 UNIT/ML Susp injection Commonly known as: HUMALOG 75/25 MIX Inject 15-25 Units into the skin 2 (two) times daily with a meal.   loperamide 2 MG capsule Commonly known as: IMODIUM Take 6 mg by mouth 2 (two) times daily.   mupirocin ointment 2 % Commonly known as: BACTROBAN Apply 1 application topically 2 (two) times daily as needed (wound care).   mycophenolate 180 MG EC tablet Commonly known as: MYFORTIC Take 360 mg by mouth 2 (two) times daily.   nebivolol 5 MG tablet Commonly known as: BYSTOLIC Take 5 mg by mouth daily.   pravastatin 80 MG tablet Commonly known as: PRAVACHOL Take 1 tablet (80 mg total) by mouth at bedtime.   PreviDent 5000 Booster Plus 1.1 % Pste Generic drug: Sodium Fluoride Place 1 application onto teeth 2 (two) times daily.   sildenafil 100 MG tablet Commonly known as: VIAGRA Take 100 mg by mouth daily as needed for erectile dysfunction.   sodium bicarbonate 650 MG tablet Take 1,300 mg by mouth 2 (two) times daily.   tamsulosin 0.4 MG Caps capsule Commonly known as: FLOMAX Take 0.4 mg by mouth daily.   traMADol 50 MG tablet Commonly known as: ULTRAM Take 50 mg by mouth 2 (two) times daily as needed for moderate pain. What changed: Another medication with the same name was added. Make sure you understand how and when to take each.   traMADol  50 MG tablet Commonly known as: ULTRAM Take 1 tablet (50 mg total) by mouth every 6 (six) hours as needed for moderate pain. What changed: You were already taking a medication with the same name, and this prescription was added. Make sure you understand how and when to take each.   Vitamin D (Ergocalciferol) 1.25 MG (50000 UT) Caps capsule Commonly known as: DRISDOL Take 50,000 Units by mouth every 7 (seven) days.        Discharge Instructions:   [facility] Discharge Instructions Carotid Endarterectomy (CEA)  Please refer to the following instructions for your post-procedure care. Your surgeon or physician assistant will discuss any changes with you.  Activity  You are encouraged  to walk as much as you can. You can slowly return to normal activities but must avoid strenuous activity and heavy lifting until your doctor tell you it's OK. Avoid activities such as vacuuming or swinging a golf club. You can drive after one week if you are comfortable and you are no longer taking prescription pain medications. It is normal to feel tired for serval weeks after your surgery. It is also normal to have difficulty with sleep habits, eating, and bowel movements after surgery. These will go away with time.  Bathing/Showering  You may shower after you come home. Do not soak in a bathtub, hot tub, or swim until the incision heals completely.  Incision Care  Shower every day. Clean your incision with mild soap and water. Pat the area dry with a clean towel. You do not need a bandage unless otherwise instructed. Do not apply any ointments or creams to your incision. You may have skin glue on your incision. Do not peel it off. It will come off on its own in about one week. Your incision may feel thickened and raised for several weeks after your surgery. This is normal and the skin will soften over time. For Men Only: It's OK to shave around the incision but do not shave  the incision itself for 2 weeks. It is common to have numbness under your chin that could last for several months.  Diet  Resume your normal diet. There are no special food restrictions following this procedure. A low fat/low cholesterol diet is recommended for all patients with vascular disease. In order to heal from your surgery, it is CRITICAL to get adequate nutrition. Your body requires vitamins, minerals, and protein. Vegetables are the best source of vitamins and minerals. Vegetables also provide the perfect balance of protein. Processed food has little nutritional value, so try to avoid this.  Medications  Resume taking all of your medications unless your doctor or physician assistant tells you not to.  If your incision is causing pain, you may take over-the- counter pain relievers such as acetaminophen (Tylenol). If you were prescribed a stronger pain medication, please be aware these medications can cause nausea and constipation.  Prevent nausea by taking the medication with a snack or meal. Avoid constipation by drinking plenty of fluids and eating foods with a high amount of fiber, such as fruits, vegetables, and grains. Do not take Tylenol if you are taking prescription pain medications.  Follow Up  Our office will schedule a follow up appointment 2-3 weeks following discharge.  Please call us immediately for any of the following conditions  Increased pain, redness, drainage (pus) from your incision site. Fever of 101 degrees or higher. If you should develop stroke (slurred speech, difficulty swallowing, weakness on one side of your body, loss of vision) you should call 911 and go to the nearest emergency room.  Reduce your risk of vascular disease:  Stop smoking. If you would like help call [facility] at 1-800-QUIT-NOW (518) 121-1014) or [place] at [phone]. Manage your cholesterol Maintain a desired weight Control your diabetes Keep your blood pressure down  If you  have any questions, please call the office at [phone].  Prescriptions given: Tramadol 50mg   Disposition: home  Patient's condition: is Good  Follow up: 1. Dr. Donnetta Hutching in 2 weeks.   Dagoberto Ligas, PA-C [facility] [phone]   --- For [facility] Registry use ---   Modified Rankin score at D/C (0-6): 0  IV medication needed for:  1. Hypertension: No 2. Hypotension: Yes  Post-op Complications: Yes  1. Post-op CVA or TIA: No  If yes: Event classification (right eye, left eye, right cortical, left cortical, verterobasilar, other):   If yes: Timing of event (intra-op, <6 hrs post-op, >=6 hrs post-op, unknown):   2. CN injury: No  If yes: CN  injuried   3. Myocardial infarction: No  If yes: Dx by (EKG or clinical, Troponin):   4.  CHF: No  5.  Dysrhythmia (new): No  6. Wound infection: No  7. Reperfusion symptoms: No  8. Return to OR: Yes  If yes: return to OR for bleeding  Discharge medications: Statin use:  No, allergy ASA use:  Yes   Beta blocker use:  No ACE-Inhibitor use:  No  ARB use:  No CCB use: No P2Y12 Antagonist use: Yes, [ x] Plavix, [ ]  Plasugrel, [ ]  Ticlopinine, [ ]  Ticagrelor, [ ]  Other, [ ]  No for medical reason, [ ]  Non-compliant, [ ]  Not-indicated Anti-coagulant use:  No, [ ]  Warfarin, [ ]  Rivaroxaban, [ ]  Dabigatran,

## 2019-02-25 ENCOUNTER — Other Ambulatory Visit: Payer: Self-pay

## 2019-02-25 ENCOUNTER — Other Ambulatory Visit: Payer: Self-pay | Admitting: *Deleted

## 2019-02-25 NOTE — Patient Outreach (Signed)
[facility]) Care Management  02/25/2019  Allen Schultz 06-10-1950 [id number]   Follow up call to [facility]- stroke patient after procedure      Insurance: united healthcare medicare and medicaid of [place]   [facility] admissions x 2 ED visits x 2 in the last 6 months  Date of Admission: 02/05/2019 Date of Discharge: 02/07/2019 Date of Admission: 02/20/19 Discharge:  02/23/19 for carotid stenosis, s/p Left Carotid endarterectomy complicated by a hematoma at left neck incision with JP drain- ICU stay  Outreach attempt successful after various attempts to reach him  Successful contact to his Brother's corrected home number Allen Schultz has lost his mobile phone  Patient is able to verify HIPAA Consent: [facility] RN CM reviewed [facility] services with patient. Patient gave verbal consent for services [facility] telephonic RN CM Permission was given to speak with his brother Allen Schultz  Follow up  Allen Schultz is staying with his brother Allen Schultz in  [place] since he has been discharged.  All his medications were obtained.  He has a left neck incision with local ecchymosis and decreased swelling since 02/24/19 per pt and [name]. Today he has completed his ADLs and clean his incision site independently, He is noted to have some word finding concerns and is noted to sleep more. He denies drainage or pain from left neck incision site. He did state he did have trouble bending.  [facility] RN CM reviewed stroke extension s/s to monitor for and to report to the MD. The 24 hour nurse call center and [facility] RN CM number provided.  Allen Schultz has assisted him to walk outside to the driveway and to the [employer] states Allen Schultz will stay with him a few more days then return to his apartment. [facility] RN CM assisted with completing a conference call to [employer]  [phone] to arrange for transportation to his 03/04/19 1045 Vascular surgeon f/u appointment.   Transition of care assessment completed   Social:Allen Schultz is a  68 year old divorced retired patient who lives alone He had his brother for support He has an associates degree He reports being independent/assist with his care needs and denies issues with transportation to medical appointments He reports he is not a morning person   Conditions:Anterolateral Left Thalamic Stroke, bilateral carotid artery stenosis with planned 02/20/19 0915 endarterectomy Allen Schultz, 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) , immunocompromised, hx of liver transplant, bladder wall thickening, CKD, DM2 insulin dependent, HLD  DME:eye glasses  Advance Directives:has POA was given forms on 02/18/19 to make changes He denies any needed assistance from [facility] for advance directives  Upcoming appointments 03/04/19 [facility] Vascular surgery 04/02/19 primary MD Allen Schultz 04/09/19 [address] 6 months   Plan: [facility] RN CM scheduled this patient for another call attempt within 7-10 business days  Route note to MD    [place]. Lavina Hamman, RN, BSN, [place] Coordinator Office number [phone] Mobile number [phone]  Main [facility] number [phone] Fax number [phone]

## 2019-02-25 NOTE — Patient Outreach (Signed)
[facility]) Care Management  02/25/2019  Allen Schultz 1950/08/01 [id number]   Follow up call to [facility]- stroke patient after procedure    Insurance:united healthcare medicareand medicaid of [place]  [facility] admissions x1ED visits x 1in the last 6 months  Date of Admission:02/05/2019 Date of Discharge:02/07/2019   [address] CM received a call from [place] stating the brother, Vevelyn Royals called [facility] requesting [facility] RN CM call Mr Beckius after his breakfast was completed  1127 Outreach attempt unsuccessful to the home number of the brother<britt as requested No answer [facility] RN CM left HIPAA compliant voicemail messages along with CM's contact info encouraging a return call to CM  Plan: [facility] RN CM scheduled this patient for another call attempt within 7-10 business days  Kimberly L. Lavina Hamman, RN, BSN, [place] Coordinator Office number [phone] Mobile number [phone]  Main [facility] number [phone] Fax number [phone]

## 2019-02-27 ENCOUNTER — Other Ambulatory Visit: Payer: Self-pay | Admitting: Internal Medicine

## 2019-02-28 ENCOUNTER — Other Ambulatory Visit: Payer: Self-pay | Admitting: *Deleted

## 2019-02-28 ENCOUNTER — Encounter: Payer: Self-pay | Admitting: *Deleted

## 2019-02-28 NOTE — Patient Outreach (Signed)
[place] [facility]) Care Management  02/28/2019  Allen Schultz 08/23/1950 [id number]   CSW made an initial attempt to try and contact patient today to perform the initial phone assessment, as well as assess and assist with social work needs and services, without success.  A HIPAA compliant message was left for patient on voicemail.  CSW is currently awaiting a return call.  CSW will make a second outreach attempt within the next 3-4 business days, if a return call is not received from patient in the meantime.  CSW will also mail an Outreach Letter to patient's home requesting that patient contact CSW if patient is interested in receiving social work services through [place] with Scientist, clinical (histocompatibility and immunogenetics).  [name], BSW, MSW, LCSW  Licensed Education officer, environmental Health System  Mailing [place] [address]. [address], [place], [place] [address] Physical Address-[address], [place], [place] [address] Toll Free Main # [phone] Fax # [phone] Cell # [phone]  Office # [phone] [email or web address]

## 2019-02-28 NOTE — Patient Outreach (Signed)
[place] [facility]) Care Management  02/28/2019  ERVEN WINKLE [birth year] [id number]   Care coordination- Return call to patient after voice message left   [facility] RN CM received a voice message from Mr Janowiak in which he reports he forgot his phone number  [facility] RN CM returned a call to him  HIPAA verified  Mr Ray updated [facility] RN CM that he has returned home to his apartment and was encouraged by his brother to call [facility] RN CM Mr Hoganson reports interest initially in home health services but with assessment he voices he is medically ok and ambulating well but needs someone to assist in "cleaning his home" [facility] RN CM discussed personal care services and companion care . THN RN CM discussed an available list from [facility] and the out of pocket expense but he voices he can not afford an out of pocket expense. [facility] discussed Location manager. THN RN CM completed a conference call with Mr Maben to [facility] [place] and left a message requesting a return call to Mr Cambray and also leaving RN CM number in case of further details Mr Finkle at this time refuses a [facility] referral for possible other assistance and also for [facility] RN CM to speak with primary care MD about home health. He prefers to await a call from Youth worker. THN RN CM discussed THN RN CM upcoming Monday 03/04/19 contact with him and offered RN CM and 24 hour nurse line contact numbers. He was encouraged to call prn   Plan: [facility] RN CM scheduled this patient for another call attempt within7-10business days    Lydia Meng CL. Lavina Hamman, RN, BSN, [place] Coordinator Office number [phone] Mobile number [phone]  Main THN number [phone] Fax number [phone]

## 2019-03-04 ENCOUNTER — Other Ambulatory Visit: Payer: Self-pay

## 2019-03-04 ENCOUNTER — Ambulatory Visit (INDEPENDENT_AMBULATORY_CARE_PROVIDER_SITE_OTHER): Payer: Self-pay | Admitting: Family

## 2019-03-04 ENCOUNTER — Other Ambulatory Visit: Payer: Self-pay | Admitting: *Deleted

## 2019-03-04 ENCOUNTER — Encounter: Payer: Self-pay | Admitting: Family

## 2019-03-04 VITALS — BP 135/62 | HR 60 | Temp 99.4°F | Resp 16 | Ht 71.0 in | Wt 280.0 lb

## 2019-03-04 DIAGNOSIS — Z944 Liver transplant status: Principal | ICD-10-CM

## 2019-03-04 DIAGNOSIS — Z79899 Other long term (current) drug therapy: Principal | ICD-10-CM

## 2019-03-04 DIAGNOSIS — Z9889 Other specified postprocedural states: Secondary | ICD-10-CM

## 2019-03-04 DIAGNOSIS — I6522 Occlusion and stenosis of left carotid artery: Secondary | ICD-10-CM

## 2019-03-04 DIAGNOSIS — Z87891 Personal history of nicotine dependence: Secondary | ICD-10-CM

## 2019-03-04 NOTE — Patient Outreach (Signed)
  [name] [facility]) Care Management  03/04/2019  Allen Schultz 06/08/1950 [id number]   Follow up call (week 2) after discharge home -s/p left carotid endarterectomy on 02/20/19   Insurance:united healthcare medicareand medicaid of [place]  [facility] admissions x1ED visits x 1in the last 6 months  Date of Admission:02/05/2019 Date of Discharge:02/07/2019   Outreach attempt successful to his home number [name] is able to verify HIPAA  He reports he is doing well He reports awaking from a nap after he visited his cardiovascular provider today s/p his left carotid endarterectomy with Dacran patch angioplasty on 02/20/19 by Dr Early for symptomatic left carotid stenosis He reports he drove himself to his appointment today when his driver did not show up. He denies any concerns with driving   His speech is clear and not slurred. He denies any trouble swallowing, or sob He is able to answer questions appropriately He denies any medical or social need at this time  DM/HTN He is not able to tell [facility] RN CM the BP and cbg values "They are about the same" when asked twice BP at MD office today was 135/62 wt 280 lbs ht 5'11" BMI 39.07 A 1c 6.0 on 02/20/19   Encouraged him to call and/or answer the call from [place] on 03/06/19   Appointments 04/02/19 Blenda Mounts primary MD 04/09/19 Dr Felecia Shelling Neurology  6 month f/u to cardiology for carotid duplex scan 12/17/18 Dr Rex Kras Ophthalmology  Plan [facility] RN CM scheduled this patient for another call attempt within7-14business days  Kimberly L. Lavina Hamman, RN, BSN, [place] Coordinator Office number [phone] Mobile number [phone]  Main THN number [phone] Fax number [phone]

## 2019-03-04 NOTE — Progress Notes (Signed)
Chief Complaint: Follow up Extracranial Carotid Artery Stenosis   History of Present Illness  Allen Schultz is a 68 y.o. male who is s/p left Carotid Endarterectomy with Dacron Patch Angioplasty on 02-20-19 by Dr. Donnetta Hutching for symptomatic left carotid stenosis.   His pre-op symptoms were dysarthria and confusion; he denies hemiparesis, denies changes in vision. He states that his family let him know that his thinking has improved.   He returns today for 2 weeks post op follow up.   He denies any trouble swallowing, denies dyspnea.   Diabetic: yes, 6.0 A1C on 02-20-19, good control Tobacco use: former smoker, quit in 1980   Pt meds include: Statin : yes ASA: yes Other anticoagulants/antiplatelets: Plavix   Past Medical History:  Diagnosis Date  . Acid reflux   . Anxiety   . Aortic stenosis   . 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 Chronic kidney disease   . Colon cancer ([place])   . Colon polyp   . Depression   . Diabetes mellitus without complication ([place])   . Heart murmur   . High cholesterol   . Hypertension   . Liver transplant recipient [facility])   . Neuropathy   . PONV (postoperative nausea and vomiting)     Social History Social History   Tobacco Use  . Smoking status: Former Smoker    Types: Cigarettes  . Smokeless tobacco: Never Used  . Tobacco comment: per patient quit smoking about 30 years ago  Substance Use Topics  . Alcohol use: No  . Drug use: No    Family History Family History  Problem Relation Age of Onset  . Cancer Mother   . Heart disease Father   . Hypertension Father     Surgical History Past Surgical History:  Procedure Laterality Date  . CHOLECYSTECTOMY    . COLON SURGERY    . EMBOLECTOMY Left 02/20/2019   Procedure: Evacuation of hematoma, exploration of carotid;  Surgeon: Rosetta Posner, MD;  Location: [place];  Service: Vascular;  Laterality: Left;  . ENDARTERECTOMY Left 02/20/2019   Procedure: ENDARTERECTOMY CAROTID LEFT;  Surgeon:  Rosetta Posner, MD;  Location: [place];  Service: Vascular;  Laterality: Left;  . [place]  . LIVER TRANSPLANT  1996  . PATCH ANGIOPLASTY Left 02/20/2019   Procedure: PATCH ANGIOPLASTY USING HEMASHIELD PLATINUM FINESSE 0.3in X 3in;  Surgeon: Rosetta Posner, MD;  Location: [place];  Service: Vascular;  Laterality: Left;  . TONSILLECTOMY      Allergies  Allergen Reactions  . Clinoril [Sulindac] Swelling    Lip swelling  . Statins Other (See Comments)    Due to transplant    Current Outpatient Medications  Medication Sig Dispense Refill  . aspirin EC 81 MG EC tablet Take 1 tablet (81 mg total) by mouth daily. 30 tablet 0  . clopidogrel (PLAVIX) 75 MG tablet Take 1 tablet (75 mg total) by mouth daily. 21 tablet 0  . clotrimazole (LOTRIMIN) 1 % cream Apply 1 application topically daily as needed (rash).     . cyclobenzaprine (FLEXERIL) 10 MG tablet Take 10 mg by mouth 3 (three) times daily as needed for muscle spasms.     Marland Kitchen escitalopram (LEXAPRO) 10 MG tablet Take 10 mg by mouth daily.    . famotidine (PEPCID) 20 MG tablet Take 20 mg by mouth daily.    Marland Kitchen gabapentin (NEURONTIN) 300 MG capsule Take 1 capsule (300 mg total) by mouth 3 (three) times daily. [place]  capsule 5  . insulin lispro protamine-lispro (HUMALOG 75/25 MIX) (75-25) 100 UNIT/ML SUSP injection Inject 15-25 Units into the skin 2 (two) times daily with a meal.     . loperamide (IMODIUM) 2 MG capsule Take 6 mg by mouth 2 (two) times daily.    . mupirocin ointment (BACTROBAN) 2 % Apply 1 application topically 2 (two) times daily as needed (wound care).    . mycophenolate (MYFORTIC) 180 MG EC tablet Take 360 mg by mouth 2 (two) times daily.     . nebivolol (BYSTOLIC) 5 MG tablet Take 5 mg by mouth daily.    . pravastatin (PRAVACHOL) 80 MG tablet Take 1 tablet (80 mg total) by mouth at bedtime. 30 tablet 0  . sildenafil (VIAGRA) 100 MG tablet Take 100 mg by mouth daily as needed for erectile dysfunction.    . sodium bicarbonate 650  MG tablet Take 1,300 mg by mouth 2 (two) times daily.    . Sodium Fluoride (PREVIDENT 5000 BOOSTER PLUS) 1.1 % PSTE Place 1 application onto teeth 2 (two) times daily.    . tamsulosin (FLOMAX) 0.4 MG CAPS capsule Take 0.4 mg by mouth daily.    . traMADol (ULTRAM) 50 MG tablet Take 50 mg by mouth 2 (two) times daily as needed for moderate pain.     . traMADol (ULTRAM) 50 MG tablet Take 1 tablet (50 mg total) by mouth every 6 (six) hours as needed for moderate pain. 15 tablet 0  . Vitamin D, Ergocalciferol, (DRISDOL) 1.25 MG (50000 UT) CAPS capsule Take 50,000 Units by mouth every 7 (seven) days.     No current facility-administered medications for this visit.     Review of Systems : See HPI for pertinent positives and negatives.  Physical Examination  Vitals:   03/04/19 1053 03/04/19 1057  BP: 139/62 135/62  Pulse: 60 60  Resp: 16   Temp: 99.4 F (37.4 C)   TempSrc: Temporal   SpO2: 99%   Weight: 280 lb (127 kg)   Height: 5\' 11"  (1.803 m)    Body mass index is 39.05 kg/m.  General: WDWN obese male in NAD GAIT: normal Eyes: Pupils are equal and round HENT: Left CEA incision edges are well proximated, no erythema, no drainage, slight swelling of tissue beneath incision.  Pulmonary:  Respirations are non-labored, good air movement in all fields, no rales,  No stridor, no rhonchi or  wheezes Cardiac: regular rhythm, +murmur. Gastrointestinal: soft, nontender, BS WNL, no r/g, no palpable masses. Musculoskeletal: no muscle atrophy/wasting. M/S 5/5 throughout, extremities without ischemic changes. 1+ pitting and non pitting edema in ankles.  Skin: No rashes, no ulcers, no cellulitis.   Neurologic:  A&O X 3; appropriate affect, sensation is normal; speech is normal, CN 2-12 intact, pain and light touch intact in extremities, motor exam as listed above. Psychiatric: Normal thought content, mood appropriate to clinical situation.    Assessment: Allen Schultz is a 68 y.o. male who  is s/p left Carotid Endarterectomy with Dacron Patch Angioplasty on 02-20-19 by Dr. Donnetta Hutching for symptomatic left carotid stenosis.   His pre-op symptoms were dysarthria and confusion; he denies hemiparesis, denies changes in vision. He states that his family let him know that his thinking has improved.   He returns today for 2 weeks post op follow up.  He denies any trouble swallowing, denies dyspnea.    Left CEA incision is healing well, minimal swelling at the incision.  He has been showering. Continue Plavix, 81 mg  ASA, and statin daily.   Plan: Follow-up in 6 months with Carotid Duplex scan.   I discussed in depth with the patient the nature of atherosclerosis, and emphasized the importance of maximal medical management including strict control of blood pressure, blood glucose, and lipid levels, obtaining regular exercise, and continued cessation of smoking.  The patient is aware that without maximal medical management the underlying atherosclerotic disease process will progress, limiting the benefit of any interventions. The patient was given information about stroke prevention and what symptoms should prompt the patient to seek immediate medical care. Thank you for allowing [place] to participate in this patient's care.  Clemon Chambers, RN, MSN, FNP-C [facility] Office: [facility] Physician: Trula Slade  03/04/19 11:10 AM

## 2019-03-04 NOTE — Patient Instructions (Signed)

## 2019-03-06 ENCOUNTER — Encounter: Payer: Self-pay | Admitting: *Deleted

## 2019-03-06 ENCOUNTER — Other Ambulatory Visit: Payer: Self-pay | Admitting: *Deleted

## 2019-03-06 NOTE — Patient Outreach (Signed)
[name] [facility]) Care Management  03/06/2019  Allen Schultz [birth year] [id number]  CSW was able to make initial contact with patient today to perform phone assessment, as well as assess and assist with social work needs and services.  CSW introduced self, explained role and types of services provided through [place] Management ([place] [facility]).  CSW further explained to patient that CSW works with patient's RNCM, also with [place] Management, [name]. CSW then explained the reason for the call, indicating that Mrs. Allen Schultz thought that patient would benefit from social work services and resources to assist with counseling and supportive services, as well as possible resources and referrals for symptoms of depression.  CSW obtained two HIPAA compliant identifiers from patient, which included patient's name and date of birth.  According to the referral, Mrs. Allen Schultz reported that patient scored a 2 on the PHQ-2 Depression Screening and a 6 on the PHQ-9 Depression Screening.  Patient admitted to experiencing symptoms of sadness, hopelessness, anxiety and emptiness; however, patient indicated that these symptoms occurred prior to his medical procedure (Left Carotid Endarterectomy with Dacron Patch Angioplasty), which took place on Wednesday, February 20, 2019.  Patient indicated that he was extremely anxious prior to this procedure, not knowing whether or not it would be a success or if he would be left in worse condition.  Patient verbalized that he has a history of Major Depressive Disorder, as well as Anxiety Disorder, and is taking Lexapro to try and combat these symptoms.    Patient stated, "I recently had surgery to remove a mole that broke off into my brain".  Patient was extremely slow to respond today and appeared to have difficulty formulating words and sentences.  Patient believes that he is getting better every day, and that he should be at full  capacity again within the next few months.  Patient admitted to experiencing mild cognitive impairment and short-term memory loss, depending on the time of day and the environment around him.  Patient indicated that he still remembers to take his prescription medications exactly as prescribed, and pays his bills in a timely fashion.  Patient reported that he is experiencing vision problems, as a result of the surgery, and has been encouraged to follow-up with an Optometrist.  Patient agreed to schedule that appointment this week.    Patient lives in a one-bedroom apartment and admits to being able to perform all activities of daily living independently, "but cannot seem to motivate himself to perform light housekeeping duties.  Patient does not wish to hire someone, nor was patient interested in receiving a list of resources.  Patient indicated that he is still able to drive himself to and from his physician appointments, to the grocery store, pharmacy, etc., not having been put on any driving restrictions.  At the present time, patient denied having an interest in receiving counseling and supportive services for symptoms of anxiety and depression, nor was patient interested in receiving a list of therapists in the community that accept [employer], patient's insurance provider.    Patient stated, "When I filled out that form, indicating that my depression symptoms were elevated, it was just because I was anxious about the surgery and worried about the condition that I would be left in".  Patient is aware that CSW is able to offer free, short-term telephonic counseling services, but continued to deny having an interest.  CSW agreed to follow-up with patient again in one week, on Wednesday,  March 13, 2019, around 11:00AM, to assess his mental health status, as well as assess his need for continued social work involvement.  Patient voiced understanding and was agreeable to this plan.  CSW was  able to ensure that patient has the correct contact information for CSW, encouraging patient to contact CSW directly if additional social work needs arise in the meantime.  [name], BSW, MSW, LCSW  Licensed Education officer, environmental Health System  Mailing [place] [address] [place], [place] [address] Physical Address-[address], [place], [place] [address] Toll Free Main # [phone] Fax # [phone] Cell # [phone]  Office # [phone] [email or web address]

## 2019-03-08 ENCOUNTER — Other Ambulatory Visit: Payer: Self-pay | Admitting: Internal Medicine

## 2019-03-11 ENCOUNTER — Other Ambulatory Visit: Payer: Self-pay

## 2019-03-11 DIAGNOSIS — I6522 Occlusion and stenosis of left carotid artery: Secondary | ICD-10-CM

## 2019-03-13 ENCOUNTER — Encounter: Payer: Self-pay | Admitting: *Deleted

## 2019-03-13 ENCOUNTER — Other Ambulatory Visit: Payer: Self-pay | Admitting: *Deleted

## 2019-03-13 NOTE — Patient Outreach (Signed)
[place] [facility]) Care Management  03/13/2019  Allen Schultz 05-15-1950 [id number]  CSW was able to make contact with patient today to follow-up regarding social work services and resources, as well as assess need for continued social work involvement.  Patient admitted that he continues to "do very well" and that "face is healing nicely".  CSW inquired about patient's vision and whether or not his sight has improved, for which patient denied.  Patient stated, "I have a call into my doctor now and I am just waiting to schedule an appointment".  Patient reported that he is trying to be proactive with his health, for which CSW commended patient.  Patient continues to endorse that he is no longer experiencing symptoms of depression and/or anxiety and that he is taking his prescription medications exactly as prescribed.     CSW will perform a case closure on patient, as all goals of treatment have been met from social work standpoint and no additional social work needs have been identified at this time.  CSW will notify patient's RNCM with [place] [facility], [name] of CSW's plans to close patient's case.  CSW will fax an update to patient's Primary Care Physician, Dr. Blenda Mounts to ensure that they are aware of CSW's involvement with patient's plan of care.  CSW was able to confirm that patient has the correct contact information for CSW, encouraging patient to contact CSW directly if additional social work needs arise in the near future or if patient changes his mind about receiving counseling and supportive services.  [name], BSW, MSW, LCSW  Licensed Education officer, environmental Health System  Mailing [place] [address], [place], [place] [address] Physical Address-[address], [place], [place] [address] Toll Free Main # [phone] Fax # [phone] Cell # [phone]  Office #  [phone] [name]

## 2019-03-18 DIAGNOSIS — Z79899 Other long term (current) drug therapy: Principal | ICD-10-CM

## 2019-03-18 DIAGNOSIS — Z944 Liver transplant status: Principal | ICD-10-CM

## 2019-03-21 ENCOUNTER — Other Ambulatory Visit: Payer: Self-pay | Admitting: Internal Medicine

## 2019-03-25 ENCOUNTER — Other Ambulatory Visit: Payer: Self-pay | Admitting: *Deleted

## 2019-03-25 NOTE — Patient Outreach (Signed)
[place] [facility]) Care Management  03/25/2019  Allen Schultz 01/27/1951 [id number]   Follow up call EMMI stroke patient  Insurance:united healthcare medicareand medicaid of [place]  [facility] admissions x1ED visits x 1in the last 6 months  Date of Admission:02/05/2019 Date of Discharge:02/07/2019  Outreach attempt # 1 No answer. THN RN CM left HIPAA compliant voicemail message along with CM's contact info.   Plan: [facility] RN CM scheduled this patient for another call attempt within 4-7 business days  Kimberly L. Lavina Hamman, RN, BSN, [place] Coordinator Office number [phone] Mobile number [phone]  Main THN number [phone] Fax number [phone]

## 2019-03-26 ENCOUNTER — Other Ambulatory Visit: Payer: Self-pay

## 2019-03-26 ENCOUNTER — Other Ambulatory Visit: Payer: Self-pay | Admitting: *Deleted

## 2019-03-26 NOTE — Patient Outreach (Signed)
[place] [facility]) Care Management  03/26/2019  Allen Schultz [birth year] [id number]   Follow up call EMMI stroke patient  Insurance:united healthcare medicareand medicaid of [place]  [facility] admissions x1ED visits x 1in the last 6 months  Date of Admission:02/05/2019 Date of Discharge:02/07/2019  Outreach attempt # successful Allen Schultz is able to verify HIPAA (DOB, address)   [facility] RN CM reviewed reason for call to follow up on EMMI contact, left carotid endarterectomy completed on 02/20/19  and case closure   Allen Schultz reports today that he is doing well except he has a f/u appointment to check his vision on 04/02/19 He reports noting his sight is getting  "Darker" Reminded Allen Schultz of the availability to call his MD office to leave messages or speak with the MD's RN if s/s worsens prior to 04/02/19 and the 24 hour nurse call center He was given the numbers for he 24 hour nurse call center and [facility] RN CM's number   He states he will call prn and will keep his MD or MD RN updated  Plans  [facility] RN CM will close case at this time as patient has been assessed and no needs identified/needs resolved.   Pt encouraged to return a call to [facility] RN CM prn  Routed note to MDs/NP/PA  Joelene Millin L. Lavina Hamman, RN, BSN, [place] Coordinator Office number [phone] Mobile number [phone]  Main THN number [phone] Fax number [phone]

## 2019-03-27 ENCOUNTER — Other Ambulatory Visit: Payer: Self-pay | Admitting: Internal Medicine

## 2019-03-31 ENCOUNTER — Other Ambulatory Visit: Payer: Self-pay | Admitting: Neurology

## 2019-04-01 DIAGNOSIS — Z944 Liver transplant status: Principal | ICD-10-CM

## 2019-04-01 DIAGNOSIS — Z79899 Other long term (current) drug therapy: Principal | ICD-10-CM

## 2019-04-09 ENCOUNTER — Encounter: Payer: Self-pay | Admitting: Neurology

## 2019-04-09 ENCOUNTER — Ambulatory Visit: Payer: Medicare Other | Admitting: Neurology

## 2019-04-09 ENCOUNTER — Other Ambulatory Visit: Payer: Self-pay

## 2019-04-09 VITALS — BP 112/65 | HR 58 | Temp 97.8°F | Ht 70.5 in | Wt 271.0 lb

## 2019-04-09 DIAGNOSIS — M4312 Spondylolisthesis, cervical region: Secondary | ICD-10-CM | POA: Insufficient documentation

## 2019-04-09 DIAGNOSIS — M4802 Spinal stenosis, cervical region: Secondary | ICD-10-CM

## 2019-04-09 DIAGNOSIS — I6300 Cerebral infarction due to thrombosis of unspecified precerebral artery: Secondary | ICD-10-CM

## 2019-04-09 DIAGNOSIS — Z944 Liver transplant status: Secondary | ICD-10-CM

## 2019-04-09 DIAGNOSIS — M4310 Spondylolisthesis, site unspecified: Secondary | ICD-10-CM

## 2019-04-09 MED ORDER — CLOPIDOGREL BISULFATE 75 MG PO TABS: 75.0000 mg | ORAL_TABLET | Freq: Every day | ORAL | 5 refills | Status: DC

## 2019-04-09 NOTE — Progress Notes (Signed)
[facility]  PATIENT: Allen Schultz DOB: 1950/05/04  REFERRING DOCTOR OR PCP:  Dr. Malen Schultz SOURCE: Patient, notes from primary care, notes from [facility], imaging and laboratory reports, MRI images personally reviewed.  _________________________________   HISTORICAL  CHIEF COMPLAINT:  Chief Complaint  Patient presents with  . Follow-up    Rm 12, alone. Last seen 10/08/2018. He had surgery last week of November with neurosurgery.    Update 04/09/19: He had a stroke mid October (left anterolateral thalamus) and had carotid surgery end of October (Dr. Donnetta Schultz, Allen Schultz).   He had [id number] stenosis LICA.    Since the stroke, he has had problems with word finding but is improving.    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    He has neck pain that fluctuates but not associated with any particular activity.    He denies much shoulder or arm pain now but this summer had pain radiating to the left shoulder.     MRI cervical spine 11/03/2018 showed: IMPRESSION: This MRI of the cervical spine without contrast shows the following: 1.   At C3-C4, there is mild anterolisthesis associated with disc protrusion, left facet hypertrophy and moderate spinal stenosis.  There is no nerve root compression.  The spondylolisthesis has developed since the 2018 MRI.  The spinal stenosis has worsened since that time. 2.   At C5-C6, there are degenerative changes causing borderline spinal stenosis and mild to moderate bilateral foraminal narrowing but no nerve root compression.  This level is stable compared to the 2018 MRI. 3.   At C6-C7, there are disc osteophyte complexes bilaterally, much more on the right.  There is moderately severe right foraminal narrowing that could lead to right C7 nerve root compression.  This level is stable compared to the 2018 MRI. 4.   Milder degenerative changes at the other cervical levels  that appear to be stable and do not lead to nerve root compression or spinal stenosis. 5.   The spinal cord appears normal.   Xray cervical spine with flexion.extension 11/12/18 showed: IMPRESSION: Mild degenerative anterolisthesis of C3 on C4 which increases minimally on flexion from 3-4 mm.     From initial consultation 10/08/2018: I had the pleasure of seeing patient, Allen Schultz, at [facility] for neurologic consultation regarding his neck pain and left shoulder pain.  He is a 68 yo man who has had neck pain x 3 years. He starts to have more pain and tightness if he looks to either side.   Pain radiates from his neck to his shoulder but not down the arm.   He denies any numbness in the left arm but feels there is some weakness, especially raising the arm over his head.    The weakness has seemed worse  the last year.  He notes some muscle spasticity in the neck.  He denies urinary changes.    Tramadol and cyclobenzaprine have helped some.     He had been seeing Dr. Doy Schultz in [facility] for many years since having Bells palsy and more recently mostly for his lower back and migraines.    His lower back pain and lumbar radicular pain has actually done better the last year.  He notes having an epidural steroid injection in the past with benefit.  He had a liver transplant (non-specific cirrhosis) at [facility] in 1996 and is regularly followed.    He has mild  renal failure with a BUN/crt of 13/1.15)  I reviewed the imaging reports and also personally reviewed the MRI images of the cervical spine.  He has mild spinal stenosis at multiple levels worse at C5-C6.  Shortly he has moderate foraminal narrowing to the left at C2C3, moderate right > left foraminal narrowing at C5C6, severe right > left foraminal narrowing at C6C7.  I showed Allen Schultz the MRI images.   Official IMPRESSION (MRI Cervical Spine) 10/19/2016):Abnormal MRI of the Cervical spine without contrast. 1.  C6-C7 degenerative disc changes resulting in right lateral recess spinal canal stenosis. Probable impingement upon the exiting right C7 nerve root. 2. C5-C6 degenerative disc and endplate changes effacing the ventral cord surface without myelopathic signal changes. Moderate right neural foraminal stenosis. 3. C3-4, C4-5 C7-T1 minor degenerative disc changes. 4. C2-3 mild left facet hypertrophy.  IMPRESSION (MRI Lumbar 06/14/17):  This is an abnormal study showing multiple abnormalities: 1.Is a disc herniation at the L2-3 level with tricompartment disease producing moderately severe central canal stenosis. There is potential for compression of any of the traversing nerve roots. 2.There is a remote compression fracture of the L1 vertebral body. 3.There are type I degenerative endplate changes of the inferior endplate of L2. 4.There is a node of the inferior endplate of L4. 5.There is a disc bulge at the L5-S1 level with an annular tear. There is no evidence of nerve root involvement. 6.There are disc bulges at all other levels of the lumbar spine as well as at T11-12. There is no clear evidence of nerve root compression at any of these levels but there is significant degenerative facet joint change seen bilaterally at all levels.  REVIEW OF SYSTEMS: Constitutional: No fevers, chills, sweats, or change in appetite Eyes: No visual changes, double vision, eye pain Ear, nose and throat: No hearing loss, ear pain, nasal congestion, sore throat Cardiovascular: No chest pain, palpitations Respiratory: No shortness of breath at rest or with exertion.   No wheezes GastrointestinaI: No nausea, vomiting, diarrhea, abdominal pain, fecal incontinence.   Liver transplant in 1996. Genitourinary: No dysuria, urinary retention or frequency.  No nocturia. Musculoskeletal: as above. Integumentary: No rash, pruritus, skin lesions Neurological: as above Psychiatric: No depression at this time.  No  anxiety Endocrine: No palpitations, diaphoresis, change in appetite, change in weigh or increased thirst Hematologic/Lymphatic: No anemia, purpura, petechiae. Allergic/Immunologic: No itchy/runny eyes, nasal congestion, recent allergic reactions, rashes  ALLERGIES: Allergies  Allergen Reactions  . Clinoril [Sulindac] Swelling    Lip swelling  . Statins Other (See Comments)    Due to transplant    HOME MEDICATIONS:  Current Outpatient Medications:  .  clotrimazole (LOTRIMIN) 1 % cream, Apply 1 application topically daily as needed (rash). , Disp: , Rfl:  .  escitalopram (LEXAPRO) 10 MG tablet, Take 10 mg by mouth daily., Disp: , Rfl:  .  gabapentin (NEURONTIN) 300 MG capsule, TAKE 1 CAPSULE(300 MG) BY MOUTH THREE TIMES DAILY, Disp: 90 capsule, Rfl: 5 .  insulin lispro protamine-lispro (HUMALOG 75/25 MIX) (75-25) 100 UNIT/ML SUSP injection, Inject 15-25 Units into the skin 2 (two) times daily with a meal. , Disp: , Rfl:  .  loperamide (IMODIUM) 2 MG capsule, Take 6 mg by mouth 2 (two) times daily., Disp: , Rfl:  .  mupirocin ointment (BACTROBAN) 2 %, Apply 1 application topically 2 (two) times daily as needed (wound care)., Disp: , Rfl:  .  mycophenolate (MYFORTIC) 180 MG EC tablet, Take 360 mg by mouth 2 (two)  times daily. , Disp: , Rfl:  .  nebivolol (BYSTOLIC) 5 MG tablet, Take 5 mg by mouth daily., Disp: , Rfl:  .  pravastatin (PRAVACHOL) 80 MG tablet, Take 1 tablet (80 mg total) by mouth at bedtime., Disp: 30 tablet, Rfl: 0 .  sildenafil (VIAGRA) 100 MG tablet, Take 100 mg by mouth daily as needed for erectile dysfunction., Disp: , Rfl:  .  sodium bicarbonate 650 MG tablet, Take 1,300 mg by mouth 2 (two) times daily., Disp: , Rfl:  .  Sodium Fluoride (PREVIDENT 5000 BOOSTER PLUS) 1.1 % PSTE, Place 1 application onto teeth 2 (two) times daily., Disp: , Rfl:  .  traMADol (ULTRAM) 50 MG tablet, Take 1 tablet (50 mg total) by mouth every 6 (six) hours as needed for moderate pain., Disp:  15 tablet, Rfl: 0 .  Vitamin D, Ergocalciferol, (DRISDOL) 1.25 MG (50000 UT) CAPS capsule, Take 50,000 Units by mouth every 7 (seven) days., Disp: , Rfl:  .  aspirin EC 81 MG EC tablet, Take 1 tablet (81 mg total) by mouth daily. (Patient not taking: Reported on 04/09/2019), Disp: 30 tablet, Rfl: 0 .  clopidogrel (PLAVIX) 75 MG tablet, Take 1 tablet (75 mg total) by mouth daily., Disp: 30 tablet, Rfl: 5 .  cyclobenzaprine (FLEXERIL) 10 MG tablet, Take 10 mg by mouth 3 (three) times daily as needed for muscle spasms. , Disp: , Rfl:  .  famotidine (PEPCID) 20 MG tablet, Take 20 mg by mouth daily., Disp: , Rfl:  .  tamsulosin (FLOMAX) 0.4 MG CAPS capsule, Take 0.4 mg by mouth daily., Disp: , Rfl:   PAST MEDICAL HISTORY: Past Medical History:  Diagnosis Date  . Acid reflux   . Anxiety   . Aortic stenosis   . 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 Chronic kidney disease   . Colon cancer ([place])   . Colon polyp   . Depression   . Diabetes mellitus without complication ([place])   . Heart murmur   . High cholesterol   . Hypertension   . Liver transplant recipient [facility])   . Neuropathy   . PONV (postoperative nausea and vomiting)     PAST SURGICAL HISTORY: Past Surgical History:  Procedure Laterality Date  . CHOLECYSTECTOMY    . COLON SURGERY    . EMBOLECTOMY Left 02/20/2019   Procedure: Evacuation of hematoma, exploration of carotid;  Surgeon: Rosetta Posner, MD;  Location: [place];  Service: Vascular;  Laterality: Left;  . ENDARTERECTOMY Left 02/20/2019   Procedure: ENDARTERECTOMY CAROTID LEFT;  Surgeon: Rosetta Posner, MD;  Location: [place];  Service: Vascular;  Laterality: Left;  . [place]  . LIVER TRANSPLANT  1996  . PATCH ANGIOPLASTY Left 02/20/2019   Procedure: PATCH ANGIOPLASTY USING HEMASHIELD PLATINUM FINESSE 0.3in X 3in;  Surgeon: Rosetta Posner, MD;  Location: [place];  Service: Vascular;  Laterality: Left;  . TONSILLECTOMY      FAMILY HISTORY: Family History  Problem Relation Age of  Onset  . Cancer Mother   . Heart disease Father   . Hypertension Father     SOCIAL HISTORY:  Social History   Socioeconomic History  . Marital status: Divorced    Spouse name: Not on file  . Number of children: 0  . Years of education: Associates  . Highest education level: Associate degree: occupational, Hotel manager, or vocational program  Occupational History  . Occupation: Retired  Tobacco Use  . Smoking status: Former Smoker    Types:  Cigarettes  . Smokeless tobacco: Never Used  . Tobacco comment: per patient quit smoking about 30 years ago  Substance and Sexual Activity  . Alcohol use: No  . Drug use: No  . Sexual activity: Not Currently  Other Topics Concern  . Not on file  Social History Narrative   HCPOA: Paola Burkins (sibling)   Right handed    Lives alone   Social Determinants of Health   Financial Resource Strain: Low Risk   . Difficulty of Paying Living Expenses: Not very hard  Food Insecurity: No Food Insecurity  . Worried About Charity fundraiser in the Last Year: Never true  . Ran Out of Food in the Last Year: Never true  Transportation Needs: No Transportation Needs  . Lack of Transportation (Medical): No  . Lack of Transportation (Non-Medical): No  Physical Activity: Inactive  . Days of Exercise per Week: 0 days  . Minutes of Exercise per Session: 0 min  Stress: Stress Concern Present  . Feeling of Stress : To some extent  Social Connections: Severely Isolated  . Frequency of Communication with Friends and Family: Once a week  . Frequency of Social Gatherings with Friends and Family: Once a week  . Attends Religious Services: Never  . Active Member of Clubs or Organizations: No  . Attends Archivist Meetings: Never  . Marital Status: Divorced  Human resources officer Violence: Not At Risk  . Fear of Current or Ex-Partner: No  . Emotionally Abused: No  . Physically Abused: No  . Sexually Abused: No     PHYSICAL EXAM  Vitals:    04/09/19 1049  BP: 112/65  Pulse: (!) 58  Temp: 97.8 F (36.6 C)  SpO2: 97%  Weight: 271 lb (122.9 kg)  Height: 5' 10.5" (1.791 m)    Body mass index is 38.33 kg/m.   General: The patient is well-developed and well-nourished and in no acute distress  Neck: He has a murmur radiating up from the heart.  The neck is nontender but has a mildly reduced range of motion.  Cardiovascular: The heart has a regular rate and rhythm with a normal S1 and S2.  He has a 4/6 systolic murmur .   Neurologic Exam  Mental status: The patient is alert and oriented x 3 at the time of the examination. The patient has apparent normal recent and remote memory, with an apparently normal attention span and concentration ability.   Speech is normal.  Cranial nerves: Extraocular movements are full.  Facial strength is normal.  Trapezius and sternocleidomastoid strength is normal. No dysarthria is noted.   No obvious hearing deficits are noted.  Motor:  Muscle bulk is normal.   Tone is normal. Strength is  5 / 5 in all 4 extremities except 4+/5 in the right triceps.   Strength 5/5 in legs except 4+/5 right EHL.  Coordination: Cerebellar testing reveals good finger-nose-finger and heel-to-shin bilaterally.  Gait and station: Station is normal.   Gait is normal. Tandem gait is mildly wide.. Romberg is negative.   Reflexes: Deep tendon reflexes are symmetric and normal bilaterally.       DIAGNOSTIC DATA (LABS, IMAGING, TESTING) - I reviewed patient records, labs, notes, testing and imaging myself where available.  Lab Results  Component Value Date   WBC 8.9 02/21/2019   HGB 10.9 (L) 02/21/2019   HCT 33.9 (L) 02/21/2019   MCV 98.0 02/21/2019   PLT 98 (L) 02/21/2019      Component Value  Date/Time   NA 134 (L) 02/22/2019 0632   K 4.2 02/22/2019 0632   CL 106 02/22/2019 0632   CO2 21 (L) 02/22/2019 0632   GLUCOSE 145 (H) 02/22/2019 0632   BUN 21 02/22/2019 0632   CREATININE 1.22 02/22/2019 0632    CALCIUM 7.7 (L) 02/22/2019 0632   PROT 6.5 02/18/2019 0952   ALBUMIN 2.6 (L) 02/18/2019 0952   AST 81 (H) 02/18/2019 0952   ALT 44 02/18/2019 0952   ALKPHOS 201 (H) 02/18/2019 0952   BILITOT 1.7 (H) 02/18/2019 0952   GFRNONAA >60 02/22/2019 0632   GFRAA >60 02/22/2019 [id number]       ASSESSMENT AND PLAN  1. Cervical stenosis of spine   2. Anterolisthesis   3. Cerebrovascular accident (CVA) due to thrombosis of precerebral artery ([place])   4. History of liver transplant ([place])   5. Cervical spinal stenosis   6. Spondylolisthesis of cervical region     1.   Refer to Neurosurgery for evaluation due to multilevel spinal stenosis, worse at C3-C4 where he also has anterolisthesis that appears unstable on flexion films.  Currently, pain is doing better than a few months ago. 2.   He states he was told to stop the aspirin and Plavix but last vascular surgery note with Clemon Chambers, NP (03/04/2019) clearly states "Continue Plavix, 81 mg ASA, and statin daily"  3.   Try to stay active and exercise as tolerated. 4.    Return in 6 months or sooner if there are new or worsening neurologic symptoms.   Myrl Bynum A. Felecia Shelling, MD, [facility] [id number], [id number] PM Certified in Neurology, Clinical Neurophysiology, Sleep Medicine and Neuroimaging  [facility] [address] [place],  [address] [phone]

## 2019-04-15 DIAGNOSIS — Z944 Liver transplant status: Principal | ICD-10-CM

## 2019-04-15 DIAGNOSIS — Z79899 Other long term (current) drug therapy: Principal | ICD-10-CM

## 2019-04-29 DIAGNOSIS — Z79899 Other long term (current) drug therapy: Principal | ICD-10-CM

## 2019-04-29 DIAGNOSIS — Z944 Liver transplant status: Principal | ICD-10-CM

## 2019-05-03 ENCOUNTER — Telehealth: Payer: Self-pay

## 2019-05-03 NOTE — Telephone Encounter (Signed)
Pt is aware and verbalized understanding.  Thurston Hole., LPN

## 2019-05-03 NOTE — Telephone Encounter (Signed)
-----   Message from Viann Fish, NP sent at 05/01/2019  9:42 AM EST ----- Regarding: FW: Clopidogrel Adolphe Fortunato, Please let Mr. Worman know that he may stop the Plavix, and instead take 81 mg aspirin daily, as advised by Dr. Donnetta Hutching. This is in regards to his carotid endarterectomy.   Thank you, Vinnie Level ----- Message ----- From: Rosetta Posner, MD Sent: 04/16/2019   4:34 PM EST To: Viann Fish, NP, [name] Bottom, MD Subject: RE: Clopidogrel                                He is fine to drop back to 81 mg only on the standpoint of his endarterectomy. Curt Jews ----- Message ----- From: Viann Fish, NP Sent: 04/10/2019   9:45 AM EST To: Rosetta Posner, MD, [name] Bottom, MD Subject: FW: Clopidogrel                                Dr. Donnetta Hutching, What do you think? Should pt remain on Plavix and daily 81 mg ASA?  Thank you, Vinnie Level  ----- Message ----- From: [name] Bottom, MD Sent: 04/09/2019  11:41 AM EST To: Sharmon Leyden Nickel, NP Subject: Clopidogrel                                    Mr. Macari is a mutual patient you last saw 03/04/2019 for Left CEA post-op care (surgery mid October)      Should he be on ASA 81 mg and Plavix 75 mg?   He states he was told by someone to stop those (he does not know who and he has been off both med's for the past month).   Your note says "Continue Plavix, 81 mg ASA, and statin daily".    Therefore, I have refilled the Plavix but wanted to confirm that he should still be on the medication.  Thank you, Arlice Colt  [facility]

## 2019-05-06 ENCOUNTER — Other Ambulatory Visit: Payer: Self-pay | Admitting: *Deleted

## 2019-05-06 ENCOUNTER — Telehealth: Payer: Self-pay | Admitting: *Deleted

## 2019-05-06 NOTE — Telephone Encounter (Signed)
-----   Message from [name] Bottom, MD sent at 05/05/2019  3:40 PM EST ----- Regarding: RE: Clopidogrel His vascular surgeon got back to me.  He can stop the clopidogrel but should continue aspirin 81 mg daily.   ----- Message ----- From: Rosetta Posner, MD Sent: 04/16/2019   4:34 PM EST To: Sharmon Leyden Nickel, NP, [name] Bottom, MD Subject: RE: Clopidogrel                                He is fine to drop back to 81 mg only on the standpoint of his endarterectomy. Curt Jews ----- Message ----- From: Viann Fish, NP Sent: 04/10/2019   9:45 AM EST To: Rosetta Posner, MD, [name] Bottom, MD Subject: FW: Clopidogrel                                Dr. Donnetta Hutching, What do you think? Should pt remain on Plavix and daily 81 mg ASA?  Thank you, Allen Schultz  ----- Message ----- From: [name] Bottom, MD Sent: 04/09/2019  11:41 AM EST To: Sharmon Leyden Nickel, NP Subject: Clopidogrel                                    Allen Schultz is a mutual patient you last saw 03/04/2019 for Left CEA post-op care (surgery mid October)      Should he be on ASA 81 mg and Plavix 75 mg?   He states he was told by someone to stop those (he does not know who and he has been off both med's for the past month).   Your note says "Continue Plavix, 81 mg ASA, and statin daily".    Therefore, I have refilled the Plavix but wanted to confirm that he should still be on the medication.  Thank you, Allen Schultz  [facility]

## 2019-05-06 NOTE — Telephone Encounter (Signed)
Called pt. Relayed he can stop clopidogrel (plavix) but he should continue ASA 81mg  po daily. He verbalized understanding. I updated his med list.

## 2019-05-13 DIAGNOSIS — Z79899 Other long term (current) drug therapy: Principal | ICD-10-CM

## 2019-05-13 DIAGNOSIS — Z944 Liver transplant status: Principal | ICD-10-CM

## 2019-05-27 ENCOUNTER — Other Ambulatory Visit: Payer: Self-pay | Admitting: Physician Assistant

## 2019-05-27 DIAGNOSIS — Z944 Liver transplant status: Principal | ICD-10-CM

## 2019-05-27 DIAGNOSIS — Z79899 Other long term (current) drug therapy: Principal | ICD-10-CM

## 2019-05-28 DIAGNOSIS — Z944 Liver transplant status: Principal | ICD-10-CM

## 2019-05-30 ENCOUNTER — Telehealth: Payer: Self-pay | Admitting: Neurology

## 2019-05-30 NOTE — Telephone Encounter (Signed)
[place] [facility] relayed that Patient was not coming to apt. Patient was going to continue to see Doctor that did his first surgery . Jayme Cloud

## 2019-06-10 DIAGNOSIS — Z944 Liver transplant status: Principal | ICD-10-CM

## 2019-06-10 DIAGNOSIS — Z79899 Other long term (current) drug therapy: Principal | ICD-10-CM

## 2019-06-12 ENCOUNTER — Emergency Department (HOSPITAL_COMMUNITY): Payer: Medicare Other

## 2019-06-12 ENCOUNTER — Encounter (HOSPITAL_COMMUNITY): Payer: Self-pay

## 2019-06-12 ENCOUNTER — Emergency Department (HOSPITAL_COMMUNITY)
Admission: EM | Admit: 2019-06-12 | Discharge: 2019-06-12 | Disposition: A | Discharge status: Home / Self Care | Payer: Medicare Other | Attending: Emergency Medicine | Admitting: Emergency Medicine

## 2019-06-12 ENCOUNTER — Other Ambulatory Visit: Payer: Self-pay

## 2019-06-12 DIAGNOSIS — Z944 Liver transplant status: Secondary | ICD-10-CM | POA: Insufficient documentation

## 2019-06-12 DIAGNOSIS — M25562 Pain in left knee: Secondary | ICD-10-CM | POA: Diagnosis not present

## 2019-06-12 DIAGNOSIS — S0001XA Abrasion of scalp, initial encounter: Secondary | ICD-10-CM | POA: Diagnosis not present

## 2019-06-12 DIAGNOSIS — T07XXXA Unspecified multiple injuries, initial encounter: Secondary | ICD-10-CM | POA: Diagnosis present

## 2019-06-12 DIAGNOSIS — Y939 Activity, unspecified: Secondary | ICD-10-CM | POA: Diagnosis not present

## 2019-06-12 DIAGNOSIS — Z23 Encounter for immunization: Secondary | ICD-10-CM | POA: Insufficient documentation

## 2019-06-12 DIAGNOSIS — S60511A Abrasion of right hand, initial encounter: Secondary | ICD-10-CM | POA: Diagnosis not present

## 2019-06-12 DIAGNOSIS — M7918 Myalgia, other site: Secondary | ICD-10-CM

## 2019-06-12 DIAGNOSIS — S0083XA Contusion of other part of head, initial encounter: Secondary | ICD-10-CM | POA: Diagnosis not present

## 2019-06-12 DIAGNOSIS — Y999 Unspecified external cause status: Secondary | ICD-10-CM | POA: Insufficient documentation

## 2019-06-12 DIAGNOSIS — N183 Chronic kidney disease, stage 3 unspecified: Secondary | ICD-10-CM | POA: Diagnosis not present

## 2019-06-12 DIAGNOSIS — Y929 Unspecified place or not applicable: Secondary | ICD-10-CM | POA: Diagnosis not present

## 2019-06-12 DIAGNOSIS — S0990XA Unspecified injury of head, initial encounter: Secondary | ICD-10-CM | POA: Insufficient documentation

## 2019-06-12 DIAGNOSIS — Z794 Long term (current) use of insulin: Secondary | ICD-10-CM | POA: Diagnosis not present

## 2019-06-12 DIAGNOSIS — I129 Hypertensive chronic kidney disease with stage 1 through stage 4 chronic kidney disease, or unspecified chronic kidney disease: Secondary | ICD-10-CM | POA: Insufficient documentation

## 2019-06-12 DIAGNOSIS — Z79899 Other long term (current) drug therapy: Secondary | ICD-10-CM | POA: Diagnosis not present

## 2019-06-12 DIAGNOSIS — Z85038 Personal history of other malignant neoplasm of large intestine: Secondary | ICD-10-CM | POA: Insufficient documentation

## 2019-06-12 DIAGNOSIS — E1122 Type 2 diabetes mellitus with diabetic chronic kidney disease: Secondary | ICD-10-CM | POA: Insufficient documentation

## 2019-06-12 MED ORDER — TETANUS-DIPHTH-ACELL PERTUSSIS 5-2.5-18.5 LF-MCG/0.5 IM SUSP
0.5000 mL | Freq: Once | INTRAMUSCULAR | Status: AC
  Administered 2019-06-12: 17:00:00 0.5 mL via INTRAMUSCULAR
  Filled 2019-06-12: qty 0.5
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
## 2019-06-12 NOTE — ED Notes (Signed)
Allen Schultz (brother) call was transferred to nurse
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
## 2019-06-12 NOTE — ED Notes (Signed)
Pt transported to CT ?

## 2019-06-12 NOTE — ED Notes (Signed)
Per patient request, [facility] was contacted to take the patient home. 15 minute eta

## 2019-06-24 DIAGNOSIS — Z944 Liver transplant status: Principal | ICD-10-CM

## 2019-06-24 DIAGNOSIS — Z79899 Other long term (current) drug therapy: Principal | ICD-10-CM

## 2019-07-08 DIAGNOSIS — Z79899 Other long term (current) drug therapy: Principal | ICD-10-CM

## 2019-07-08 DIAGNOSIS — Z944 Liver transplant status: Principal | ICD-10-CM

## 2019-07-22 DIAGNOSIS — Z944 Liver transplant status: Principal | ICD-10-CM

## 2019-07-22 DIAGNOSIS — Z79899 Other long term (current) drug therapy: Principal | ICD-10-CM

## 2019-08-05 DIAGNOSIS — Z944 Liver transplant status: Principal | ICD-10-CM

## 2019-08-05 DIAGNOSIS — Z79899 Other long term (current) drug therapy: Principal | ICD-10-CM

## 2019-08-13 DIAGNOSIS — D649 Anemia, unspecified: Secondary | ICD-10-CM | POA: Insufficient documentation

## 2019-08-19 DIAGNOSIS — Z944 Liver transplant status: Principal | ICD-10-CM

## 2019-08-19 DIAGNOSIS — Z79899 Other long term (current) drug therapy: Principal | ICD-10-CM

## 2019-09-04 DIAGNOSIS — Z944 Liver transplant status: Principal | ICD-10-CM

## 2019-09-09 ENCOUNTER — Other Ambulatory Visit: Payer: Self-pay | Admitting: Neurology

## 2019-09-16 DIAGNOSIS — Z944 Liver transplant status: Principal | ICD-10-CM

## 2019-09-30 DIAGNOSIS — Z944 Liver transplant status: Principal | ICD-10-CM

## 2019-10-07 DIAGNOSIS — Z944 Liver transplant status: Principal | ICD-10-CM

## 2019-10-07 DIAGNOSIS — Z79899 Other long term (current) drug therapy: Principal | ICD-10-CM

## 2019-10-08 ENCOUNTER — Ambulatory Visit: Payer: Medicare Other | Admitting: Family Medicine

## 2019-10-14 ENCOUNTER — Ambulatory Visit: Payer: Medicare Other | Admitting: Neurology

## 2019-10-14 ENCOUNTER — Ambulatory Visit: Payer: Medicare Other | Admitting: Family Medicine

## 2019-10-14 DIAGNOSIS — Z944 Liver transplant status: Principal | ICD-10-CM

## 2019-10-18 ENCOUNTER — Other Ambulatory Visit: Payer: Self-pay | Admitting: Physician Assistant

## 2019-10-18 DIAGNOSIS — I5022 Chronic systolic (congestive) heart failure: Secondary | ICD-10-CM | POA: Insufficient documentation

## 2019-10-23 DIAGNOSIS — E509 Vitamin A deficiency, unspecified: Secondary | ICD-10-CM | POA: Insufficient documentation

## 2019-10-28 DIAGNOSIS — Z944 Liver transplant status: Principal | ICD-10-CM

## 2019-11-09 DIAGNOSIS — Z944 Liver transplant status: Principal | ICD-10-CM

## 2019-11-11 DIAGNOSIS — Z944 Liver transplant status: Principal | ICD-10-CM

## 2019-11-11 MED ORDER — MYCOPHENOLATE SODIUM 180 MG TABLET,DELAYED RELEASE
ORAL_TABLET | 3 refills | 0 days | Status: CP
Start: 2019-11-11 — End: ?

## 2019-11-18 ENCOUNTER — Ambulatory Visit: Admit: 2019-11-18 | Payer: MEDICARE

## 2019-11-25 DIAGNOSIS — Z944 Liver transplant status: Principal | ICD-10-CM

## 2019-12-09 DIAGNOSIS — Z944 Liver transplant status: Principal | ICD-10-CM

## 2019-12-23 DIAGNOSIS — Z944 Liver transplant status: Principal | ICD-10-CM

## 2019-12-24 DIAGNOSIS — Z944 Liver transplant status: Principal | ICD-10-CM

## 2020-01-06 DIAGNOSIS — Z944 Liver transplant status: Principal | ICD-10-CM

## 2020-01-15 ENCOUNTER — Other Ambulatory Visit: Payer: Self-pay

## 2020-01-15 DIAGNOSIS — I6522 Occlusion and stenosis of left carotid artery: Secondary | ICD-10-CM

## 2020-01-20 DIAGNOSIS — Z944 Liver transplant status: Principal | ICD-10-CM

## 2020-01-27 ENCOUNTER — Ambulatory Visit (HOSPITAL_COMMUNITY): Payer: Medicare Other

## 2020-01-27 ENCOUNTER — Ambulatory Visit: Payer: Medicare Other

## 2020-02-03 DIAGNOSIS — Z944 Liver transplant status: Principal | ICD-10-CM

## 2020-02-13 MED ORDER — FERROUS SULFATE 325 MG (65 MG IRON) TABLET
Freq: Two times a day (BID) | ORAL | 0 days
Start: 2020-02-13 — End: 2020-05-13

## 2020-02-13 MED ORDER — PANTOPRAZOLE 40 MG TABLET,DELAYED RELEASE
Freq: Two times a day (BID) | ORAL | 0 days
Start: 2020-02-13 — End: 2020-05-13

## 2020-02-17 ENCOUNTER — Ambulatory Visit: Payer: Medicare Other

## 2020-02-17 ENCOUNTER — Inpatient Hospital Stay (HOSPITAL_COMMUNITY): Admission: RE | Admit: 2020-02-17 | Payer: Medicare Other | Source: Ambulatory Visit

## 2020-02-17 DIAGNOSIS — Z944 Liver transplant status: Principal | ICD-10-CM

## 2020-03-02 DIAGNOSIS — Z944 Liver transplant status: Principal | ICD-10-CM

## 2020-03-16 DIAGNOSIS — Z944 Liver transplant status: Principal | ICD-10-CM

## 2020-03-30 DIAGNOSIS — Z944 Liver transplant status: Principal | ICD-10-CM

## 2020-04-01 MED ORDER — CLOPIDOGREL 75 MG TABLET
ORAL | 0 days
Start: 2020-04-01 — End: 2020-05-14

## 2020-04-02 MED ORDER — FUROSEMIDE 40 MG TABLET
Freq: Every day | ORAL | 0 days
Start: 2020-04-02 — End: ?

## 2020-04-13 DIAGNOSIS — Z944 Liver transplant status: Principal | ICD-10-CM

## 2020-04-27 DIAGNOSIS — Z944 Liver transplant status: Principal | ICD-10-CM

## 2020-05-01 DIAGNOSIS — Z944 Liver transplant status: Principal | ICD-10-CM

## 2020-05-02 IMAGING — CR CERVICAL SPINE COMPLETE WITH FLEXION AND EXTENSION VIEWS
7 series · 7 of 7 positions shown · non-contrast
Comparison: None.

CLINICAL DATA: Left-sided neck and shoulder pain for several years,
no known injury, initial encounter

EXAM:
CERVICAL SPINE COMPLETE WITH FLEXION AND EXTENSION VIEWS

[w cervical spine lat]
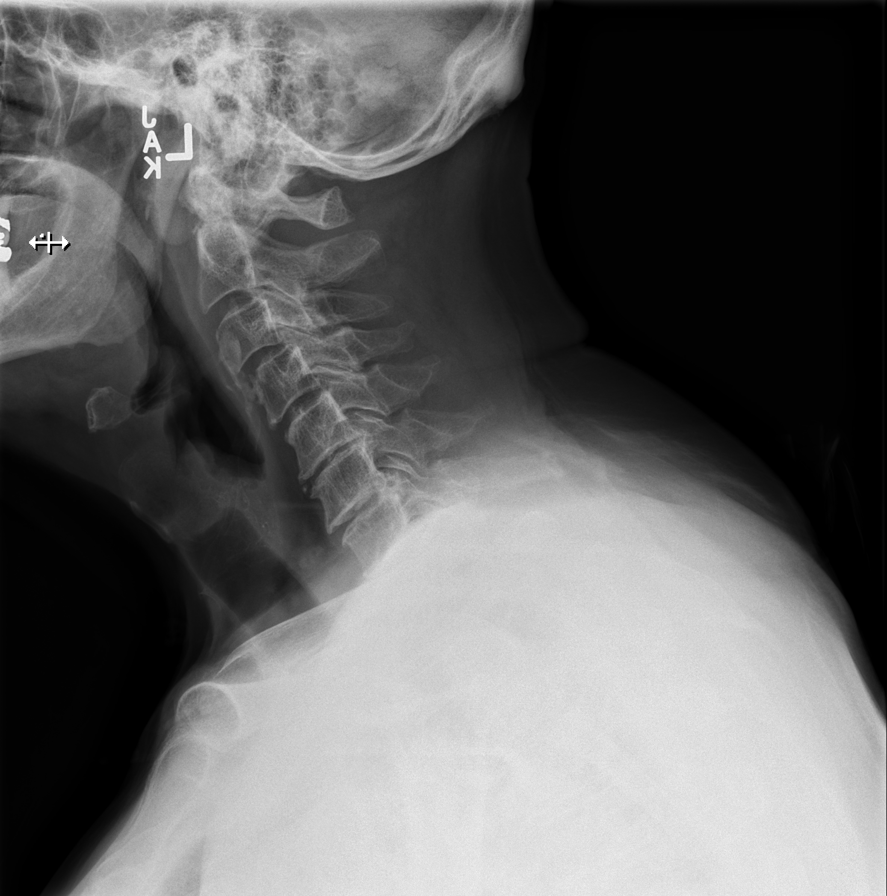

[w cervical spine flexion]
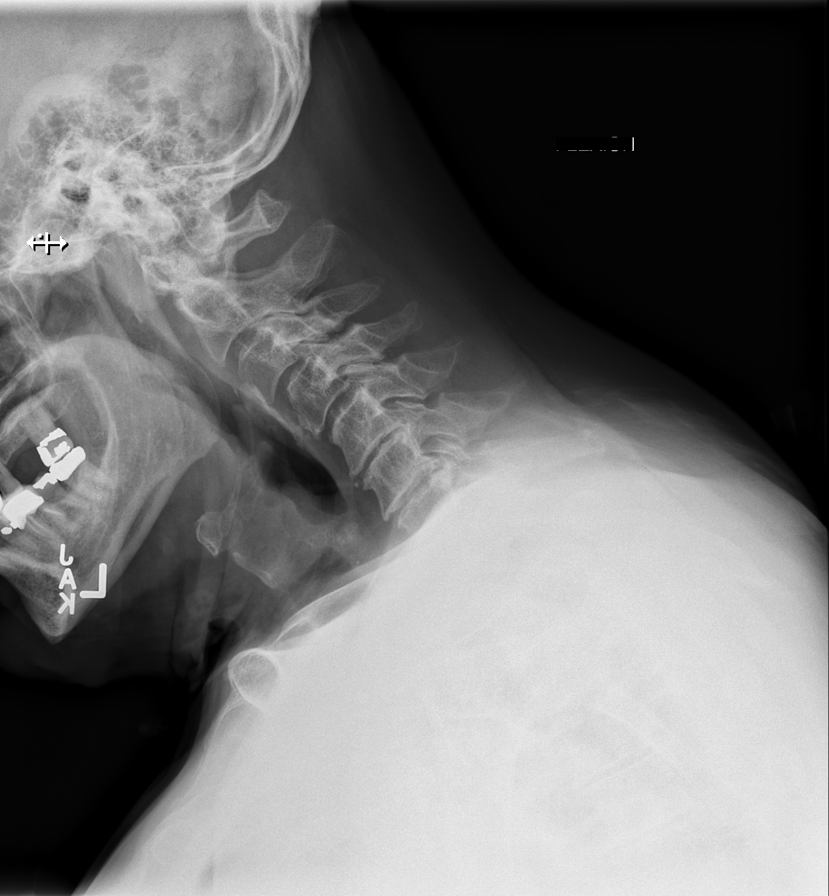

[w cervical spine extension]
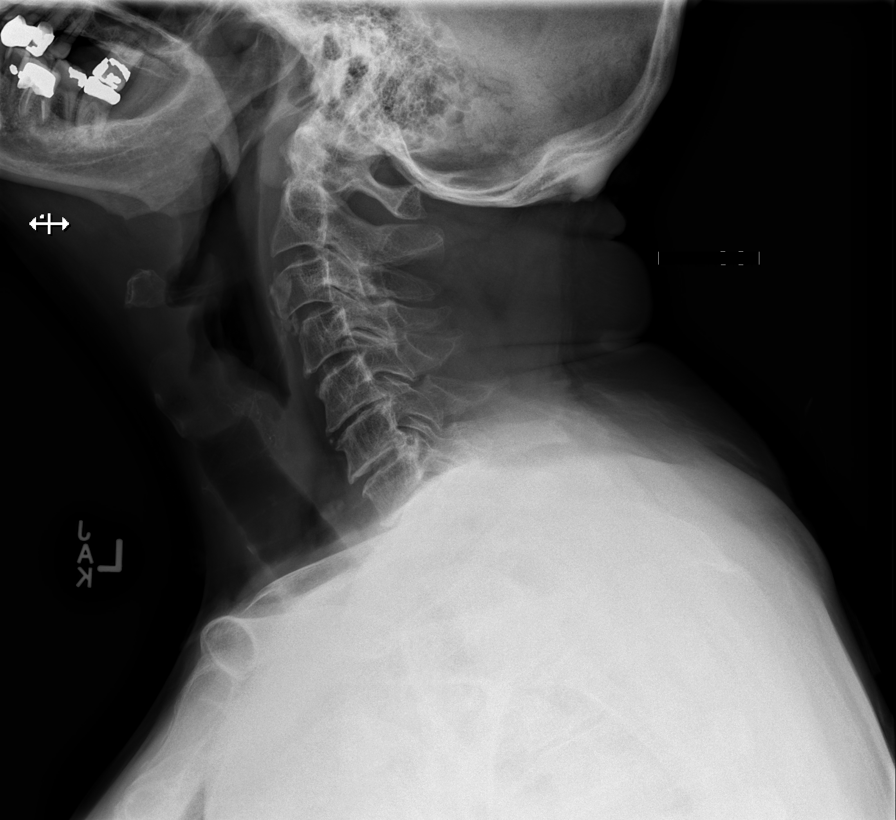

[w cervical spine ap_obl (1 of 2)]
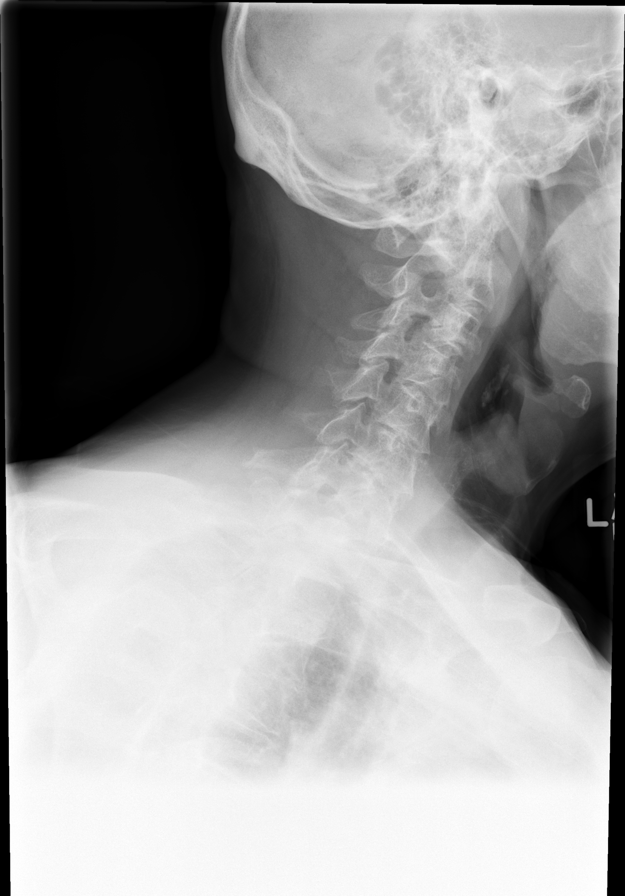

[w cervical spine ap_obl (2 of 2)]
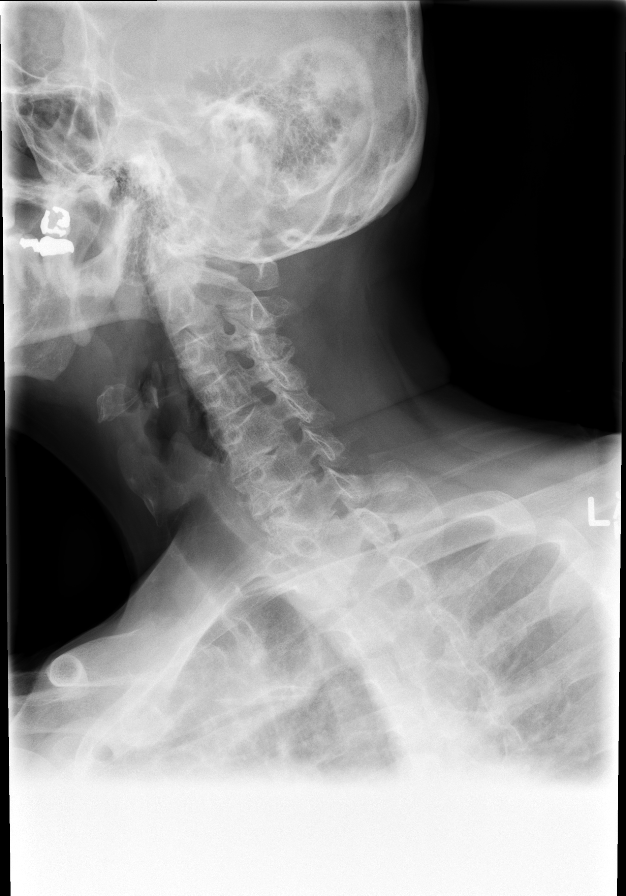

[w cervical spine ap]
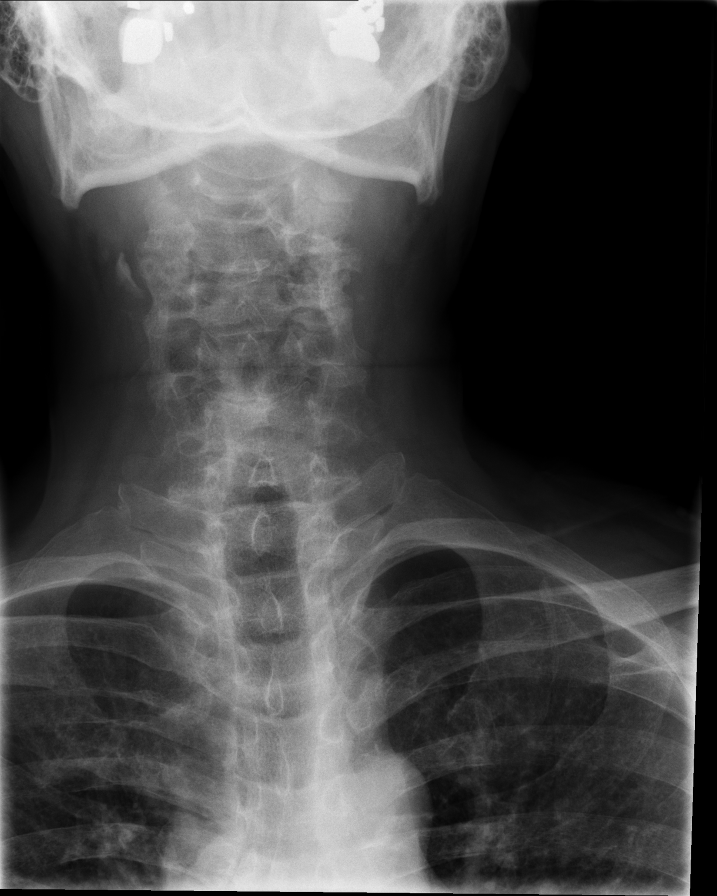

[w cervical spine odontoid]
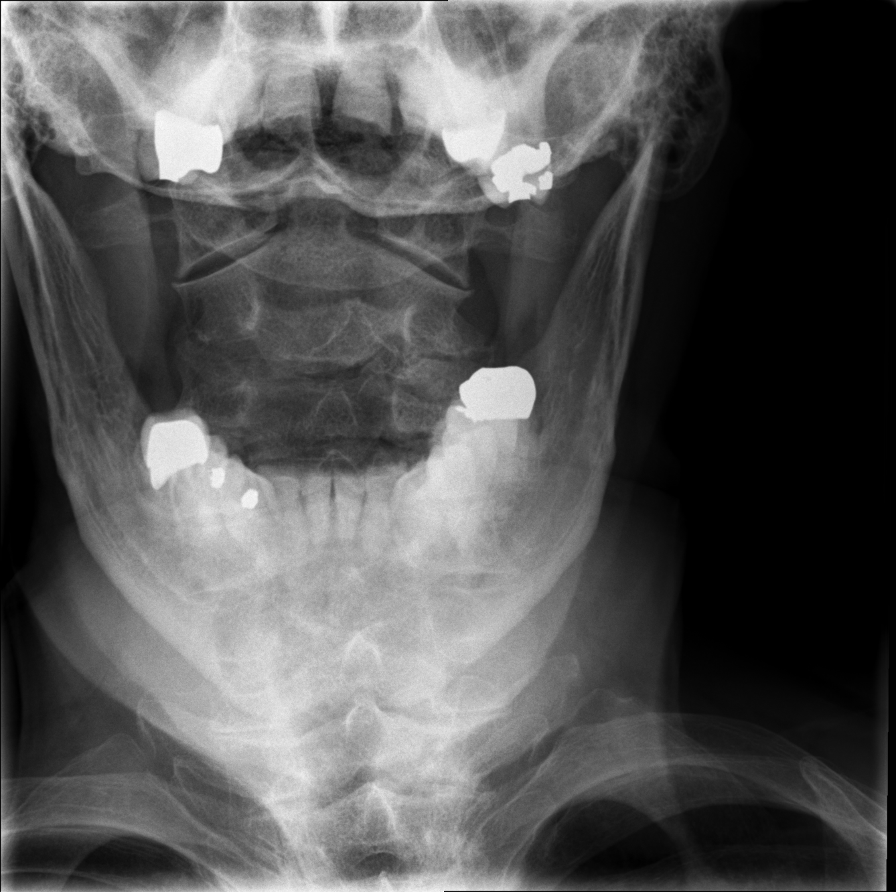

[7 of 7 positions shown; findings below may reference images not displayed]

FINDINGS: Seven cervical segments are well visualized. Mild degenerative
anterolisthesis of C3 on C4 of approximately 3 mm is noted. Minimal
increase on flexion is noted. The overall appearance is stable on
extension. Disc space narrowing is noted at C5-6 and C6-7 with
associated osteophytes. No soft tissue abnormality is noted. Minimal
neural foraminal changes are seen. The odontoid is within normal
limits.
IMPRESSION: Mild degenerative anterolisthesis of C3 on C4 which increases
minimally on flexion from 3-4 mm.

## 2020-05-04 MED ORDER — SPIRONOLACTONE 100 MG TABLET
Freq: Every day | ORAL | 0 days
Start: 2020-05-04 — End: ?

## 2020-05-04 MED ORDER — LACTULOSE 10 GRAM/15 ML ORAL SOLUTION
Freq: Every day | ORAL | 0 days
Start: 2020-05-04 — End: ?

## 2020-05-05 ENCOUNTER — Encounter: Admit: 2020-05-05 | Discharge: 2020-05-06 | Payer: MEDICARE | Attending: Internal Medicine | Primary: Internal Medicine

## 2020-05-05 ENCOUNTER — Encounter: Admit: 2020-05-05 | Discharge: 2020-05-06 | Payer: MEDICARE

## 2020-05-05 DIAGNOSIS — T8640 Unspecified complication of liver transplant: Principal | ICD-10-CM

## 2020-05-05 DIAGNOSIS — G9340 Encephalopathy, unspecified: Principal | ICD-10-CM

## 2020-05-05 DIAGNOSIS — Z944 Liver transplant status: Principal | ICD-10-CM

## 2020-05-05 MED ORDER — LANTUS U-100 INSULIN 100 UNIT/ML SUBCUTANEOUS SOLUTION
0 days
Start: 2020-05-05 — End: ?

## 2020-05-05 MED ORDER — RIFAXIMIN 550 MG TABLET
ORAL_TABLET | Freq: Two times a day (BID) | ORAL | 11 refills | 30 days | Status: CP
Start: 2020-05-05 — End: ?
  Filled 2020-05-14: qty 60, 30d supply, fill #0

## 2020-05-06 DIAGNOSIS — T8640 Unspecified complication of liver transplant: Principal | ICD-10-CM

## 2020-05-06 DIAGNOSIS — Z944 Liver transplant status: Principal | ICD-10-CM

## 2020-05-06 DIAGNOSIS — G9340 Encephalopathy, unspecified: Principal | ICD-10-CM

## 2020-05-11 DIAGNOSIS — Z944 Liver transplant status: Principal | ICD-10-CM

## 2020-05-13 NOTE — Unmapped (Signed)
[facility] Specialty Medication Onboarding    Specialty Medication: Xifaxan 550 mg tablet  Prior Authorization: Approved   Financial Assistance: No - copay  <$25  Final Copay/Day Supply: $9.85 / 30 day supply    Insurance Restrictions: None     Notes to Pharmacist:     The triage team has completed the benefits investigation and has determined that the patient is able to fill this medication at [facility]. Please contact the patient to complete the onboarding or follow up with the prescribing physician as needed.

## 2020-05-14 ENCOUNTER — Telehealth: Payer: Self-pay | Admitting: Neurology

## 2020-05-14 NOTE — Unmapped (Signed)
Clinical Social Worker Telephone Note    Name:Siddhant Dustan Hyams  Date of Birth:10-22-1950  VWU:[id number]    REFERRAL INFORMATION:  Luis French is post-transplant for liver transplantation . CSW follows up to assess post-transplant support planning.    IMPRESSION: Attempted to return call to [facility], patient's brother who wanted update on palliative care referral.   He states that he has a new phone but to use the old number.   Called [phone] and [phone]. Unable to LVM on either line. Will continue to try.     Received return call from [place]. He was not in a service area around his home but aware that CSW had attempted to meet him. He wanted to make sure that CSW was aware of hospice referral and CSW confirmed. He was very thankful that CSW had started to work on services for his brother. Offered emotional support and he was appreciative.       Carole Binning, LCSW-A  Transplant Case Manager  05/14/2020. 11:08 AM
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
## 2020-05-14 NOTE — Telephone Encounter (Signed)
Pt's brother, Andris Brothers (on [place]) called, Hospice has been called in. He will not need [facility] services, may have only about 2 weeks.Marland Kitchen

## 2020-05-19 ENCOUNTER — Ambulatory Visit: Payer: Medicare Other | Admitting: Neurology

## 2020-05-25 DIAGNOSIS — Z944 Liver transplant status: Principal | ICD-10-CM

## 2020-06-05 NOTE — Unmapped (Signed)
[facility] Clinical Assessment & Refill Coordination Note    Luis French, DOB: 28-May-1950  Phone: [phone] (home)     All above HIPAA information was verified with patient's family member, Fuller Canada (brother).     Was a Nurse, learning disability used for this call? No    Specialty Medication(s):   Infectious Disease: Xifaxan     Current Outpatient Medications   Medication Sig Dispense Refill   ??? lactulose (CHRONULAC) 10 gram/15 mL solution Take 10 g by mouth daily. Dose reduced to once daily     ??? cholecalciferol, vitamin D3, 50,000 unit capsule Take 50,000 Units by mouth once a week.      ??? fluoride, sodium, (PREVIDENT 5000 BOOSTER PLUS) 1.1 % Pste Apply to teeth.     ??? furosemide (LASIX) 40 MG tablet Take 1 tablet by mouth daily.     ??? gabapentin (NEURONTIN) 300 MG capsule Take 300 mg by mouth Two (2) times a day.      ??? HUMALOG MIX 75-25,U-100,INSULN 100 unit/mL (75-25) Susp INJECT 35 UNITS UNDER THE SKIN BEFORE BREAKFAST AND SUPPER DAILY (Patient taking differently: 20 units in the am and 15 units in the pm) 40 mL 3   ??? LANTUS U-100 INSULIN 100 unit/mL injection Will be starting the week of 05/18/20 and will be stopping the Humalog mix     ??? megestroL (MEGACE) 40 MG tablet Take 40 mg by mouth daily.     ??? melatonin 3 mg cap Take 1 capsule by mouth.     ??? mupirocin (BACTROBAN) 2 % ointment daily as needed. (Patient not taking: Reported on 05/05/2020)     ??? mycophenolate (MYFORTIC) 180 MG EC tablet TAKE 2 TABLETS BY MOUTH 2 TIMES DAILY 360 tablet 3   ??? ONETOUCH DELICA LANCETS 30 gauge Misc   3   ??? ONETOUCH VERIO Strp   3   ??? rifAXIMin (XIFAXAN) 550 mg Tab Take 1 tablet (550 mg total) by mouth Two (2) times a day. 60 tablet 11   ??? spironolactone (ALDACTONE) 100 MG tablet Take 1 tablet by mouth daily.     ??? tamsulosin (FLOMAX) 0.4 mg capsule Take 0.4 mg by mouth daily.     ??? traMADol (ULTRAM) 50 mg tablet Take 25 mg by mouth daily as needed.      ??? vitamin A-3,000 mcg RAE, 10,000 UNIT, 3,000 mcg RAE (10,000 UNIT) capsule Take 3,000 mcg of RAE by mouth daily.       No current facility-administered medications for this visit.        Changes to medications: In hospice now: started lorazepam and hydromorphone and lowered lactulose to 15 ml once daily    Allergies   Allergen Reactions   ??? Statins-Hmg-Coa Reductase Inhibitors Other (See Comments)     Due to transplant   ??? Sulindac Other (See Comments)       Changes to allergies: No    SPECIALTY MEDICATION ADHERENCE     Xifaxan 550 mg: 7-8 days of medicine on hand       Medication Adherence    Patient reported X missed doses in the last month: 0  Specialty Medication: Xifaxan 550mg   Patient is on additional specialty medications: No  Demonstrates understanding of importance of adherence: yes  Informant: brother, Moshe Cipro.  Reliability of informant: reliable  Provider-estimated medication adherence level: good  Patient is at risk for Non-Adherence: No          Specialty medication(s) dose(s) confirmed:  Regimen is correct and unchanged.     Are there any concerns with adherence? No    Adherence counseling provided? Not needed    CLINICAL MANAGEMENT AND INTERVENTION      Clinical Benefit Assessment:    Do you feel the medicine is effective or helping your condition? Yes    Clinical Benefit counseling provided? Not needed    Adverse Effects Assessment:    Are you experiencing any side effects? No    Are you experiencing difficulty administering your medicine? No    Quality of Life Assessment:    How many days over the past month did your hepatic encephalpathy  keep you from your normal activities? For example, brushing your teeth or getting up in the morning. 0    Have you discussed this with your provider? Not needed    Therapy Appropriateness:    Is therapy appropriate? Yes, therapy is appropriate and should be continued    DISEASE/MEDICATION-SPECIFIC INFORMATION      N/A    PATIENT SPECIFIC NEEDS     - Does the patient have any physical, cognitive, or cultural barriers? Yes - patient is bedridden with need of assistance for ambulation    - Is the patient high risk? No    - Does the patient require a Care Management Plan? No     - Does the patient require physician intervention or other additional services (i.e. nutrition, smoking cessation, social work)? No      SHIPPING     Specialty Medication(s) to be Shipped:   Infectious Disease: Xifaxan    Other medication(s) to be shipped: No additional medications requested for fill at this time     Changes to insurance: No    Delivery Scheduled: Yes, Expected medication delivery date: 02/15/262.     Medication will be delivered via UPS to the confirmed prescription address in [facility].    The patient will receive a drug information handout for each medication shipped and additional FDA Medication Guides as required.  Verified that patient has previously received a Conservation officer, historic buildings.    All of the patient's questions and concerns have been addressed.    Roderic Palau   [facility] Specialty Pharmacist

## 2020-06-08 DIAGNOSIS — Z944 Liver transplant status: Principal | ICD-10-CM

## 2020-06-08 MED FILL — XIFAXAN 550 MG TABLET: ORAL | 30 days supply | Qty: 60 | Fill #1

## 2020-06-22 DIAGNOSIS — Z944 Liver transplant status: Principal | ICD-10-CM

## 2020-07-02 NOTE — Unmapped (Signed)
This patient has been disenrolled from the [facility] specialty pharmacy services due to patient is deceased.    Luis French  [facility] Specialty Pharmacist

## 2020-07-02 NOTE — Unmapped (Signed)
Received notice from pharmacy staff today (July 02, 2020 at 11:00am) that patient passed away; according to their note when they spoke with brother, patient passed away on 2020/07/26.     Tried calling pt's brother, Moshe Cipro, via his cell phone but received not in service message with multiple attempts. Tried calling the work # but did not choose a prompt to reach [place].   Saw the Care Everywhere note from June 29, 2020 about receiving message from the [facility] that patient passed away on 26-Jul-2020.   Tried calling the phone # listed in the Care Everywhere message but did not leave a VM.     Sent message to [facility] coordinator and TPAs about pt's passing and to remove patient from the annual appointment list.

## 2020-07-02 NOTE — Unmapped (Signed)
07/02/20-spoke with Luis French states pt passed away:03/06-he will need to be dis-enrolled from [facility]

## 2020-07-06 DIAGNOSIS — Z944 Liver transplant status: Principal | ICD-10-CM

## 2020-07-20 DIAGNOSIS — Z944 Liver transplant status: Principal | ICD-10-CM

## 2020-07-24 DEATH — deceased

## 2020-08-03 DIAGNOSIS — Z944 Liver transplant status: Principal | ICD-10-CM

## 2020-08-17 DIAGNOSIS — Z944 Liver transplant status: Principal | ICD-10-CM

## 2020-08-31 DIAGNOSIS — Z944 Liver transplant status: Principal | ICD-10-CM
